# Patient Record
Sex: Female | Born: 1990 | Race: White | Hispanic: No | Marital: Married | Smoking: Never smoker
Health system: Southern US, Community
[De-identification: ages and names within clinical notes are randomized; demographics above are authoritative.]

## PROBLEM LIST (undated history)

## (undated) ENCOUNTER — Inpatient Hospital Stay (HOSPITAL_COMMUNITY): Payer: Self-pay

## (undated) DIAGNOSIS — D649 Anemia, unspecified: Secondary | ICD-10-CM

## (undated) DIAGNOSIS — H269 Unspecified cataract: Secondary | ICD-10-CM

## (undated) DIAGNOSIS — Z789 Other specified health status: Secondary | ICD-10-CM

## (undated) HISTORY — DX: Unspecified cataract: H26.9

## (undated) HISTORY — PX: NO PAST SURGERIES: SHX2092

## (undated) HISTORY — PX: IUD REMOVAL: SHX5392

## (undated) HISTORY — DX: Anemia, unspecified: D64.9

---
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
## 2010-09-20 ENCOUNTER — Inpatient Hospital Stay (HOSPITAL_COMMUNITY): Admission: RE | Admit: 2010-09-20 | Discharge: 2010-09-22 | Payer: Self-pay | Admitting: Obstetrics and Gynecology

## 2010-09-28 ENCOUNTER — Inpatient Hospital Stay (HOSPITAL_COMMUNITY): Admission: AD | Admit: 2010-09-28 | Discharge: 2010-09-28 | Payer: Self-pay | Admitting: Obstetrics and Gynecology

## 2010-09-28 ENCOUNTER — Ambulatory Visit: Payer: Self-pay | Admitting: Nurse Practitioner

## 2011-02-01 LAB — URINE CULTURE

## 2011-02-01 LAB — URINALYSIS, ROUTINE W REFLEX MICROSCOPIC
Bilirubin Urine: NEGATIVE
Specific Gravity, Urine: 1.025 (ref 1.005–1.030)
pH: 6 (ref 5.0–8.0)

## 2011-02-01 LAB — CBC
HCT: 36.7 % (ref 36.0–46.0)
Hemoglobin: 12.3 g/dL (ref 12.0–15.0)
MCV: 91.2 fL (ref 78.0–100.0)
Platelets: 138 10*3/uL — ABNORMAL LOW (ref 150–400)
RBC: 3.74 MIL/uL — ABNORMAL LOW (ref 3.87–5.11)
RBC: 3.99 MIL/uL (ref 3.87–5.11)
RDW: 13.7 % (ref 11.5–15.5)
WBC: 13.3 10*3/uL — ABNORMAL HIGH (ref 4.0–10.5)
WBC: 15.3 10*3/uL — ABNORMAL HIGH (ref 4.0–10.5)

## 2011-02-01 LAB — DIFFERENTIAL
Lymphocytes Relative: 7 % — ABNORMAL LOW (ref 12–46)
Lymphs Abs: 1 10*3/uL (ref 0.7–4.0)
Monocytes Absolute: 1.1 10*3/uL — ABNORMAL HIGH (ref 0.1–1.0)
Monocytes Relative: 8 % (ref 3–12)
Neutro Abs: 11.1 10*3/uL — ABNORMAL HIGH (ref 1.7–7.7)
Neutrophils Relative %: 84 % — ABNORMAL HIGH (ref 43–77)

## 2011-02-01 LAB — URINE MICROSCOPIC-ADD ON

## 2011-02-02 LAB — CBC
HCT: 36.6 % (ref 36.0–46.0)
Hemoglobin: 12.5 g/dL (ref 12.0–15.0)
MCH: 31 pg (ref 26.0–34.0)
MCHC: 34 g/dL (ref 30.0–36.0)
RBC: 4.01 MIL/uL (ref 3.87–5.11)

## 2011-02-02 LAB — ABO/RH: ABO/RH(D): O POS

## 2011-02-02 LAB — RPR: RPR Ser Ql: NONREACTIVE

## 2011-04-25 ENCOUNTER — Emergency Department (HOSPITAL_COMMUNITY)
Admission: EM | Admit: 2011-04-25 | Discharge: 2011-04-26 | Disposition: A | Discharge status: Home / Self Care | Payer: BC Managed Care – PPO | Attending: Emergency Medicine | Admitting: Emergency Medicine

## 2011-04-25 DIAGNOSIS — IMO0001 Reserved for inherently not codable concepts without codable children: Secondary | ICD-10-CM | POA: Insufficient documentation

## 2011-04-25 DIAGNOSIS — R509 Fever, unspecified: Secondary | ICD-10-CM | POA: Insufficient documentation

## 2011-04-25 DIAGNOSIS — B9789 Other viral agents as the cause of diseases classified elsewhere: Secondary | ICD-10-CM | POA: Insufficient documentation

## 2011-04-26 LAB — DIFFERENTIAL
Basophils Absolute: 0 10*3/uL (ref 0.0–0.1)
Eosinophils Relative: 0 % (ref 0–5)
Lymphocytes Relative: 3 % — ABNORMAL LOW (ref 12–46)
Lymphs Abs: 0.3 10*3/uL — ABNORMAL LOW (ref 0.7–4.0)
Neutro Abs: 10.4 10*3/uL — ABNORMAL HIGH (ref 1.7–7.7)
Neutrophils Relative %: 90 % — ABNORMAL HIGH (ref 43–77)

## 2011-04-26 LAB — URINALYSIS, ROUTINE W REFLEX MICROSCOPIC
Bilirubin Urine: NEGATIVE
Nitrite: NEGATIVE
Specific Gravity, Urine: 1.023 (ref 1.005–1.030)
Urobilinogen, UA: 1 mg/dL (ref 0.0–1.0)
pH: 6 (ref 5.0–8.0)

## 2011-04-26 LAB — CBC
HCT: 38.4 % (ref 36.0–46.0)
Hemoglobin: 12.9 g/dL (ref 12.0–15.0)
MCV: 84.2 fL (ref 78.0–100.0)
RBC: 4.56 MIL/uL (ref 3.87–5.11)
RDW: 12.7 % (ref 11.5–15.5)
WBC: 11.6 10*3/uL — ABNORMAL HIGH (ref 4.0–10.5)

## 2011-04-26 LAB — POCT I-STAT, CHEM 8
Chloride: 105 mEq/L (ref 96–112)
HCT: 39 % (ref 36.0–46.0)
Hemoglobin: 13.3 g/dL (ref 12.0–15.0)
Potassium: 3.4 mEq/L — ABNORMAL LOW (ref 3.5–5.1)
Sodium: 138 mEq/L (ref 135–145)

## 2012-10-14 ENCOUNTER — Emergency Department (HOSPITAL_COMMUNITY): Payer: BC Managed Care – PPO

## 2012-10-14 ENCOUNTER — Encounter (HOSPITAL_COMMUNITY): Payer: Self-pay | Admitting: *Deleted

## 2012-10-14 ENCOUNTER — Emergency Department (HOSPITAL_COMMUNITY)
Admission: EM | Admit: 2012-10-14 | Discharge: 2012-10-14 | Disposition: A | Discharge status: Home / Self Care | Payer: BC Managed Care – PPO | Attending: Emergency Medicine | Admitting: Emergency Medicine

## 2012-10-14 DIAGNOSIS — O4690 Antepartum hemorrhage, unspecified, unspecified trimester: Secondary | ICD-10-CM

## 2012-10-14 DIAGNOSIS — O209 Hemorrhage in early pregnancy, unspecified: Secondary | ICD-10-CM | POA: Insufficient documentation

## 2012-10-14 DIAGNOSIS — O200 Threatened abortion: Secondary | ICD-10-CM | POA: Insufficient documentation

## 2012-10-14 LAB — URINALYSIS, ROUTINE W REFLEX MICROSCOPIC
Bilirubin Urine: NEGATIVE
Ketones, ur: NEGATIVE mg/dL
Nitrite: NEGATIVE
Protein, ur: NEGATIVE mg/dL
pH: 7 (ref 5.0–8.0)

## 2012-10-14 LAB — WET PREP, GENITAL
Clue Cells Wet Prep HPF POC: NONE SEEN
Trich, Wet Prep: NONE SEEN
WBC, Wet Prep HPF POC: NONE SEEN

## 2012-10-14 LAB — URINE MICROSCOPIC-ADD ON: Urine-Other: NONE SEEN

## 2012-10-14 NOTE — ED Notes (Signed)
Patient states that she is 6-[redacted] weeks pregnant has not seen an OB GYN  Because she was waiting on her insurance to go through. Around 4:40 pm today while patient was at work she stood up and blood began "gushing" out of her. States she was spotting 3 days ago. Patient denies any pain at this time but states their is some mild cramping

## 2012-10-14 NOTE — ED Provider Notes (Signed)
Medical screening examination/treatment/procedure(s) were performed by non-physician practitioner and as supervising physician I was immediately available for consultation/collaboration. Devoria Albe, MD, Armando Gang   Ward Givens, MD 10/14/12 [phone]

## 2012-10-14 NOTE — ED Provider Notes (Signed)
History     CSN: [id number]  Arrival date & time 10/14/12  1723   First MD Initiated Contact with Patient 10/14/12 1728      No chief complaint on file.   (Consider location/radiation/quality/duration/timing/severity/associated sxs/prior treatment) HPI Comments: 21 year old female presents the emergency department with vaginal bleeding beginning about 45 minutes ago. Patient states she was sitting at work when she went to stand up and felt blood "gushing" from her vagina. She went to the bathroom and noticed a lot of blood coming out of her vagina. Currently she is without any abdominal pain or cramping, however states she has some cramping about 2 days ago. Admits to mild back pain. Patient is 6-[redacted] weeks pregnant, however she has not seen an OB/GYN yet due to insurance reasons. She does have an OB/GYN Dr. Hyacinth Meeker. This is not her first pregnancy. Last menstrual period at the end of September. Denies lightheadedness, weakness or shortness of breath.  The history is provided by the patient.    No past medical history on file.  No past surgical history on file.  No family history on file.  History  Substance Use Topics  . Smoking status: Not on file  . Smokeless tobacco: Not on file  . Alcohol Use: Not on file    OB History    No data available      Review of Systems  Constitutional: Negative for fever, chills and appetite change.  HENT: Negative.   Respiratory: Negative for shortness of breath.   Cardiovascular: Negative for chest pain.  Gastrointestinal: Negative for nausea, vomiting and abdominal pain.  Genitourinary: Positive for vaginal bleeding. Negative for dysuria, frequency, vaginal pain and pelvic pain.  Musculoskeletal: Positive for back pain.  Skin: Negative.   Neurological: Negative for light-headedness and headaches.  Psychiatric/Behavioral: The patient is not nervous/anxious.     Allergies  Review of patient's allergies indicates not on file.  Home  Medications  No current outpatient prescriptions on file.  There were no vitals taken for this visit.  Physical Exam  Nursing note and vitals reviewed. Constitutional: She is oriented to person, place, and time. She appears well-developed and well-nourished. No distress.  HENT:  Head: Normocephalic and atraumatic.  Mouth/Throat: Oropharynx is clear and moist.  Eyes: Conjunctivae normal are normal.  Neck: Normal range of motion. Neck supple.  Cardiovascular: Regular rhythm and normal heart sounds.  Tachycardia present.   Pulmonary/Chest: Effort normal and breath sounds normal.  Abdominal: Soft. Bowel sounds are normal. There is no tenderness.  Genitourinary: Uterus normal. Uterus is not tender. Cervix exhibits no motion tenderness and no discharge. Right adnexum displays tenderness. Right adnexum displays no mass. Left adnexum displays no mass and no tenderness. There is bleeding (large) around the vagina. No tenderness around the vagina. No vaginal discharge found.  Musculoskeletal: Normal range of motion. She exhibits no edema.  Neurological: She is alert and oriented to person, place, and time.  Skin: Skin is warm and dry.  Psychiatric: She has a normal mood and affect. Her behavior is normal.    ED Course  Procedures (including critical care time)  Labs Reviewed  URINALYSIS, ROUTINE W REFLEX MICROSCOPIC - Abnormal; Notable for the following:    Hgb urine dipstick MODERATE (*)     All other components within normal limits  POCT PREGNANCY, URINE - Abnormal; Notable for the following:    Preg Test, Ur POSITIVE (*)     All other components within normal limits  WET PREP, GENITAL  URINE  MICROSCOPIC-ADD ON  GC/CHLAMYDIA PROBE AMP   US Ob Comp Less 14 Wks  10/14/2012  *RADIOLOGY REPORT*  Clinical Data: Vaginal bleeding.  OBSTETRIC <14 WK ULTRASOUND  Technique:  Transabdominal ultrasound was performed for evaluation of the gestation as well as the maternal uterus and adnexal  regions.  Comparison:  None.  Intrauterine gestational sac: Single gestational sac is ovoid in shape. Yolk sac: Present. Embryo: Present. Cardiac Activity: Present. Heart Rate: 158 bpm  CRL:  15.1 mm  7 w  6 d       [place] EDC: 05/27/2013  Maternal uterus/Adnexae: Moderate volume of subchorionic hemorrhage.  Ovaries are normal in echotexture and appearance bilaterally. Trace volume of free fluid the cul-de-sac.  IMPRESSION: 1.  Single viable IUP with crown rump length of 15.1 mm corresponding to an estimated gestational age of [redacted] weeks and 6 days.  Fetal heart rate of 158 beats per minute. 2.  Moderate subchorionic hemorrhage.   Original Report Authenticated By: Trudie Reed, M.D.      1. Vaginal bleeding in pregnancy   2. Threatened abortion       MDM  21 y/o female with threatened abortion. US showing single viable IUP around 7 weeks and 6 days. Patient is in NAD and in no pain. I advised her to rest and avoid any hard physical activity until she sees ob/gyn. Explained importance of close follow up with Dr. Hyacinth Meeker. She is already on prenatal vitamins. Close return precautions discussed.        Trevor Mace, PA-C 10/14/12 1932

## 2012-10-14 NOTE — ED Notes (Signed)
Patient is alert and oriented x3.  She was given DC instructions and follow up visit instructions.  Patient gave verbal understanding. She was DC ambulatory under her own power to home.  V/S stable.  He was not showing any signs of distress on DC 

## 2012-10-15 LAB — GC/CHLAMYDIA PROBE AMP: GC Probe RNA: NEGATIVE

## 2012-11-01 ENCOUNTER — Emergency Department (HOSPITAL_COMMUNITY)
Admission: EM | Admit: 2012-11-01 | Discharge: 2012-11-02 | Disposition: A | Discharge status: Home / Self Care | Payer: BC Managed Care – PPO | Attending: Emergency Medicine | Admitting: Emergency Medicine

## 2012-11-01 DIAGNOSIS — O4690 Antepartum hemorrhage, unspecified, unspecified trimester: Secondary | ICD-10-CM

## 2012-11-01 DIAGNOSIS — R112 Nausea with vomiting, unspecified: Secondary | ICD-10-CM | POA: Insufficient documentation

## 2012-11-01 DIAGNOSIS — M549 Dorsalgia, unspecified: Secondary | ICD-10-CM | POA: Insufficient documentation

## 2012-11-01 DIAGNOSIS — O209 Hemorrhage in early pregnancy, unspecified: Secondary | ICD-10-CM | POA: Insufficient documentation

## 2012-11-01 DIAGNOSIS — O99891 Other specified diseases and conditions complicating pregnancy: Secondary | ICD-10-CM | POA: Insufficient documentation

## 2012-11-01 DIAGNOSIS — R109 Unspecified abdominal pain: Secondary | ICD-10-CM | POA: Insufficient documentation

## 2012-11-01 NOTE — ED Notes (Signed)
Pt states that she has had abdominal pain for about 2 weeks. Pt states she started having bleeding when standing up today. Pt describes the blood as thick, dark and mucus like. Pt states she only has bleeding when standing. Pt states she was diagnosed 2 weeks ago with hemorrhaging.

## 2012-11-02 LAB — URINALYSIS, ROUTINE W REFLEX MICROSCOPIC
Bilirubin Urine: NEGATIVE
Ketones, ur: 15 mg/dL — AB
Nitrite: NEGATIVE
Specific Gravity, Urine: 1.029 (ref 1.005–1.030)
Urobilinogen, UA: 1 mg/dL (ref 0.0–1.0)

## 2012-11-02 LAB — POCT I-STAT, CHEM 8
Calcium, Ion: 1.24 mmol/L — ABNORMAL HIGH (ref 1.12–1.23)
Glucose, Bld: 75 mg/dL (ref 70–99)
HCT: 35 % — ABNORMAL LOW (ref 36.0–46.0)
Hemoglobin: 11.9 g/dL — ABNORMAL LOW (ref 12.0–15.0)
Potassium: 3.7 mEq/L (ref 3.5–5.1)
TCO2: 22 mmol/L (ref 0–100)

## 2012-11-02 LAB — CBC WITH DIFFERENTIAL/PLATELET
Basophils Absolute: 0 10*3/uL (ref 0.0–0.1)
Basophils Relative: 0 % (ref 0–1)
HCT: 34.4 % — ABNORMAL LOW (ref 36.0–46.0)
MCHC: 35.5 g/dL (ref 30.0–36.0)
Monocytes Absolute: 0.7 10*3/uL (ref 0.1–1.0)
Neutro Abs: 6.5 10*3/uL (ref 1.7–7.7)
RDW: 12.3 % (ref 11.5–15.5)

## 2012-11-02 NOTE — ED Provider Notes (Signed)
History     CSN: [id number]  Arrival date & time 11/01/12  2249   First MD Initiated Contact with Patient 11/02/12 0010      Chief Complaint  Patient presents with  . Abdominal Pain  . Vaginal Bleeding    (Consider location/radiation/quality/duration/timing/severity/associated sxs/prior treatment) HPI Theresa Stewart is a 21 y.o. female who presents with complaint of vaginal bleeding. States she is about [redacted]wks pregnant, she is G2P1. States  Was seen 3 weeks ago for the same complaint here, at that time, she had blood work done, [place], which showed a live IUP at 7wk 6d,  and moderate subchorionic hemorrhage. Pt states after she was discharged, bleeding stopped utnil today. Pt denies having any abdominal pain. States hips hurting when she walks and having normal morning nausea and at times vomiting. Pt states she is passing clots when stands up. Unable to tell how heavy bleeding but changed pad 4 times today.     No past medical history on file.  No past surgical history on file.  Family History  Problem Relation Age of Onset  . Family history unknown: Yes    History  Substance Use Topics  . Smoking status: Never Smoker   . Smokeless tobacco: Not on file  . Alcohol Use: No    OB History    Grav Para Term Preterm Abortions TAB SAB Ect Mult Living   1               Review of Systems  Constitutional: Negative for fever and chills.  Respiratory: Negative.   Cardiovascular: Negative.   Gastrointestinal: Positive for nausea and vomiting. Negative for abdominal pain, diarrhea and constipation.  Genitourinary: Positive for vaginal bleeding. Negative for dysuria, flank pain, vaginal pain and pelvic pain.  Musculoskeletal: Positive for back pain.  Neurological: Negative for dizziness, weakness and light-headedness.    Allergies  Review of patient's allergies indicates no known allergies.  Home Medications   Current Outpatient Rx  Name  Route  Sig  Dispense  Refill  .  PRENATAL MULTIVITAMIN CH   Oral   Take 1 tablet by mouth daily.           BP 106/68  Pulse 79  Temp 99 F (37.2 C) (Oral)  Resp 18  SpO2 100%  LMP 08/20/2012  Physical Exam  Nursing note and vitals reviewed. Constitutional: She is oriented to person, place, and time. She appears well-developed and well-nourished. No distress.  HENT:  Head: Normocephalic.  Eyes: Conjunctivae normal are normal.  Neck: Neck supple.  Cardiovascular: Normal rate, regular rhythm and normal heart sounds.   Pulmonary/Chest: Effort normal and breath sounds normal. No respiratory distress. She has no wheezes. She has no rales.  Abdominal: Soft. Bowel sounds are normal. She exhibits no distension. There is no tenderness. There is no rebound and no guarding.  Genitourinary:       Normal external genitalia. Dark blood and small maroon and brown clots in the vaginal canal. Cervix is closed  Neurological: She is alert and oriented to person, place, and time.  Skin: Skin is warm and dry.  Psychiatric: She has a normal mood and affect. Her behavior is normal.    ED Course  Procedures (including critical care time)  Results for orders placed during the hospital encounter of 11/01/12  CBC WITH DIFFERENTIAL      Component Value Range   WBC 9.3  4.0 - 10.5 K/uL   RBC 4.12  3.87 - 5.11 MIL/uL  Hemoglobin 12.2  12.0 - 15.0 g/dL   HCT 16.1 (*) 09.6 - 04.5 %   MCV 83.5  78.0 - 100.0 fL   MCH 29.6  26.0 - 34.0 pg   MCHC 35.5  30.0 - 36.0 g/dL   RDW 40.9  81.1 - 91.4 %   Platelets 144 (*) 150 - 400 K/uL   Neutrophils Relative 70  43 - 77 %   Neutro Abs 6.5  1.7 - 7.7 K/uL   Lymphocytes Relative 22  12 - 46 %   Lymphs Abs 2.0  0.7 - 4.0 K/uL   Monocytes Relative 8  3 - 12 %   Monocytes Absolute 0.7  0.1 - 1.0 K/uL   Eosinophils Relative 1  0 - 5 %   Eosinophils Absolute 0.1  0.0 - 0.7 K/uL   Basophils Relative 0  0 - 1 %   Basophils Absolute 0.0  0.0 - 0.1 K/uL  URINALYSIS, ROUTINE W REFLEX MICROSCOPIC       Component Value Range   Color, Urine YELLOW  YELLOW   APPearance CLEAR  CLEAR   Specific Gravity, Urine 1.029  1.005 - 1.030   pH 5.5  5.0 - 8.0   Glucose, UA NEGATIVE  NEGATIVE mg/dL   Hgb urine dipstick LARGE (*) NEGATIVE   Bilirubin Urine NEGATIVE  NEGATIVE   Ketones, ur 15 (*) NEGATIVE mg/dL   Protein, ur NEGATIVE  NEGATIVE mg/dL   Urobilinogen, UA 1.0  0.0 - 1.0 mg/dL   Nitrite NEGATIVE  NEGATIVE   Leukocytes, UA NEGATIVE  NEGATIVE  POCT I-STAT, CHEM 8      Component Value Range   Sodium 138  135 - 145 mEq/L   Potassium 3.7  3.5 - 5.1 mEq/L   Chloride 104  96 - 112 mEq/L   BUN 7  6 - 23 mg/dL   Creatinine, Ser 7.82  0.50 - 1.10 mg/dL   Glucose, Bld 75  70 - 99 mg/dL   Calcium, Ion 9.56 (*) 1.12 - 1.23 mmol/L   TCO2 22  0 - 100 mmol/L   Hemoglobin 11.9 (*) 12.0 - 15.0 g/dL   HCT 21.3 (*) 08.6 - 57.8 %  URINE MICROSCOPIC-ADD ON      Component Value Range   Squamous Epithelial / LPF FEW (*) RARE   WBC, UA 0-2  <3 WBC/hpf   RBC / HPF 21-50  <3 RBC/hpf   Bacteria, UA FEW (*) RARE   Urine-Other MUCOUS PRESENT         1. Vaginal bleeding in pregnancy       MDM  Pt with vaginal bleeding during pregnancy, about 10wks. [place] 3 wks ago showed subchorionic hemorrhage. Pt is not having any abdominal pain, cramping, tenderness. Her lab work is unremarkable. Her exam showed closed cervix. I suspect pt is having either recurrent subchorionic hemorrhage or threatened abortion. I discussed pt with Dr. Hyacinth Meeker. Do not think any imaging today will change the treatment plan. Pt to be d/c home with pelvic rest, follow up with [facility] if bleeding worsens or develop abdominal pain. Pt voiced understanding.   Filed Vitals:   11/01/12 2315  BP: 106/68  Pulse: 79  Temp: 99 F (37.2 C)  TempSrc: Oral  Resp: 18  SpO2: 100%           Lottie Mussel, PA 11/02/12 [phone]

## 2012-11-02 NOTE — ED Provider Notes (Signed)
Medical screening examination/treatment/procedure(s) were performed by non-physician practitioner and as supervising physician I was immediately available for consultation/collaboration.    Vida Roller, MD 11/02/12 [phone]

## 2013-05-16 ENCOUNTER — Inpatient Hospital Stay (HOSPITAL_COMMUNITY)
Admission: AD | Admit: 2013-05-16 | Discharge: 2013-05-16 | Disposition: A | Discharge status: Home / Self Care | Payer: Medicaid Other | Source: Ambulatory Visit | Attending: Obstetrics and Gynecology | Admitting: Obstetrics and Gynecology

## 2013-05-16 ENCOUNTER — Encounter (HOSPITAL_COMMUNITY): Payer: Self-pay | Admitting: *Deleted

## 2013-05-16 DIAGNOSIS — O368190 Decreased fetal movements, unspecified trimester, not applicable or unspecified: Secondary | ICD-10-CM | POA: Insufficient documentation

## 2013-05-16 DIAGNOSIS — O368130 Decreased fetal movements, third trimester, not applicable or unspecified: Secondary | ICD-10-CM

## 2013-05-16 HISTORY — DX: Other specified health status: Z78.9

## 2013-05-16 NOTE — MAU Note (Signed)
Pt reports has not felt baby movement  much since yesterday.

## 2013-05-16 NOTE — MAU Provider Note (Signed)
  History     CSN: [id number]  Arrival date and time: 05/16/13 2036   None     Chief Complaint  Patient presents with  . Decreased Fetal Movement   HPI  Theresa Stewart is a 22 y.o. G2P1001 at [redacted]w[redacted]d who presents today with decreased fetl movement. She had not felt the baby moved since 1130 today, until she got in the care to come here. At that time she began to feel the baby move some.   Past Medical History  Diagnosis Date  . Medical history non-contributory     Past Surgical History  Procedure Laterality Date  . No past surgeries      History reviewed. No pertinent family history.  History  Substance Use Topics  . Smoking status: Never Smoker   . Smokeless tobacco: Not on file  . Alcohol Use: No    Allergies: No Known Allergies  Prescriptions prior to admission  Medication Sig Dispense Refill  . calcium carbonate (TUMS EX) 750 MG chewable tablet Chew 2 tablets by mouth at bedtime as needed for heartburn.      . Prenatal Vit-Fe Fumarate-FA (PRENATAL MULTIVITAMIN) TABS Take 1 tablet by mouth at bedtime.         ROS Physical Exam   Blood pressure 129/84, pulse 100, temperature 98.6 F (37 C), temperature source Oral, resp. rate 20, height 5\' 9"  (1.753 m), weight 82.827 kg (182 lb 9.6 oz), last menstrual period 08/20/2012.  Physical Exam  NST: 125, moderate variability with 15x15 accels, no decels Toco: no UCs  MAU Course  Procedures  2108: Spoke with Dr. Ambrose Mantle, ok for dc home.   Assessment and Plan   1. Decreased fetal movement in pregnancy in third trimester, antepartum    Reactive NST Fetal kick counts Labor precautions FU with the office as scheduled.    Tawnya Crook 05/16/2013, 9:08 PM

## 2013-05-21 ENCOUNTER — Observation Stay (HOSPITAL_COMMUNITY)
Admission: EM | Admit: 2013-05-21 | Discharge: 2013-05-22 | Disposition: A | Discharge status: Home / Self Care | Payer: No Typology Code available for payment source | Attending: Obstetrics and Gynecology | Admitting: Obstetrics and Gynecology

## 2013-05-21 ENCOUNTER — Encounter (HOSPITAL_COMMUNITY): Payer: Self-pay | Admitting: *Deleted

## 2013-05-21 DIAGNOSIS — R109 Unspecified abdominal pain: Secondary | ICD-10-CM | POA: Insufficient documentation

## 2013-05-21 DIAGNOSIS — Z3490 Encounter for supervision of normal pregnancy, unspecified, unspecified trimester: Secondary | ICD-10-CM

## 2013-05-21 DIAGNOSIS — R51 Headache: Secondary | ICD-10-CM | POA: Insufficient documentation

## 2013-05-21 DIAGNOSIS — Y92410 Unspecified street and highway as the place of occurrence of the external cause: Secondary | ICD-10-CM | POA: Insufficient documentation

## 2013-05-21 DIAGNOSIS — O479 False labor, unspecified: Principal | ICD-10-CM | POA: Insufficient documentation

## 2013-05-21 LAB — CBC
Platelets: 163 10*3/uL (ref 150–400)
RBC: 3.96 MIL/uL (ref 3.87–5.11)
RDW: 14.5 % (ref 11.5–15.5)
WBC: 11.7 10*3/uL — ABNORMAL HIGH (ref 4.0–10.5)

## 2013-05-21 LAB — OB RESULTS CONSOLE ANTIBODY SCREEN: Antibody Screen: NEGATIVE

## 2013-05-21 LAB — OB RESULTS CONSOLE RUBELLA ANTIBODY, IGM: Rubella: IMMUNE

## 2013-05-21 LAB — OB RESULTS CONSOLE HIV ANTIBODY (ROUTINE TESTING): HIV: NONREACTIVE

## 2013-05-21 LAB — TYPE AND SCREEN
ABO/RH(D): O POS
Antibody Screen: NEGATIVE

## 2013-05-21 LAB — RPR: RPR Ser Ql: NONREACTIVE

## 2013-05-21 LAB — OB RESULTS CONSOLE HEPATITIS B SURFACE ANTIGEN: Hepatitis B Surface Ag: NEGATIVE

## 2013-05-21 MED ORDER — CALCIUM CARBONATE ANTACID 500 MG PO CHEW
2.0000 | CHEWABLE_TABLET | ORAL | Status: DC | PRN
  Administered 2013-05-21: 400 mg via ORAL
  Filled 2013-05-21: qty 2

## 2013-05-21 MED ORDER — ACETAMINOPHEN 325 MG PO TABS
650.0000 mg | ORAL_TABLET | ORAL | Status: DC | PRN
  Administered 2013-05-21 – 2013-05-22 (×4): 650 mg via ORAL
  Filled 2013-05-21 (×4): qty 2

## 2013-05-21 MED ORDER — ZOLPIDEM TARTRATE 5 MG PO TABS
5.0000 mg | ORAL_TABLET | Freq: Every evening | ORAL | Status: DC | PRN
  Administered 2013-05-21: 5 mg via ORAL
  Filled 2013-05-21: qty 1

## 2013-05-21 MED ORDER — SODIUM CHLORIDE 0.9 % IJ SOLN: 3.0000 mL | INTRAMUSCULAR | Status: DC | PRN

## 2013-05-21 MED ORDER — DOCUSATE SODIUM 100 MG PO CAPS
100.0000 mg | ORAL_CAPSULE | Freq: Every day | ORAL | Status: DC
  Administered 2013-05-21: 100 mg via ORAL
  Filled 2013-05-21: qty 1

## 2013-05-21 MED ORDER — PRENATAL MULTIVITAMIN CH
1.0000 | ORAL_TABLET | Freq: Every day | ORAL | Status: DC
  Filled 2013-05-21: qty 1

## 2013-05-21 NOTE — ED Notes (Signed)
Report given to [facility]. Pt being prepared for transport.
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
## 2013-05-21 NOTE — ED Notes (Signed)
OB RN at the bedside

## 2013-05-21 NOTE — ED Notes (Signed)
[facility] contacted for transport 

## 2013-05-21 NOTE — ED Notes (Signed)
[facility] called to [facility]. [facility] called for transport.
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
## 2013-05-22 DIAGNOSIS — Z3490 Encounter for supervision of normal pregnancy, unspecified, unspecified trimester: Secondary | ICD-10-CM

## 2013-05-22 MED ORDER — CYCLOBENZAPRINE HCL 10 MG PO TABS
10.0000 mg | ORAL_TABLET | Freq: Once | ORAL | Status: AC
  Administered 2013-05-22: 10 mg via ORAL
  Filled 2013-05-22: qty 1

## 2013-05-22 MED ORDER — ACETAMINOPHEN 325 MG PO TABS: 650.0000 mg | ORAL_TABLET | ORAL | Status: DC | PRN
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
## 2013-05-22 NOTE — Progress Notes (Signed)
Patient ID: Theresa Stewart, female   DOB: [birth year], 22 y.o.   MRN: [id number] Pt very sore this AM but contractions have definitely improved Baby has been reactive with a baseline of 120.  Very active! Will d/c home this PM after 24 hours of monitoring

## 2013-05-22 NOTE — Progress Notes (Signed)
Ur chart review completed.  

## 2013-05-24 ENCOUNTER — Inpatient Hospital Stay (HOSPITAL_COMMUNITY)
Admission: AD | Admit: 2013-05-24 | Discharge: 2013-05-27 | DRG: 372 | Disposition: A | Discharge status: Home / Self Care | Payer: BC Managed Care – PPO | Source: Ambulatory Visit | Attending: Obstetrics and Gynecology | Admitting: Obstetrics and Gynecology

## 2013-05-24 ENCOUNTER — Other Ambulatory Visit: Payer: Self-pay | Admitting: Obstetrics and Gynecology

## 2013-05-24 DIAGNOSIS — Z3490 Encounter for supervision of normal pregnancy, unspecified, unspecified trimester: Secondary | ICD-10-CM

## 2013-05-24 LAB — CBC
Platelets: 149 10*3/uL — ABNORMAL LOW (ref 150–400)
RDW: 14.6 % (ref 11.5–15.5)
WBC: 9.2 10*3/uL (ref 4.0–10.5)

## 2013-05-24 MED ORDER — OXYTOCIN 40 UNITS IN LACTATED RINGERS INFUSION - SIMPLE MED
1.0000 m[IU]/min | INTRAVENOUS | Status: DC
  Administered 2013-05-24: 1 m[IU]/min via INTRAVENOUS
  Filled 2013-05-24: qty 1000

## 2013-05-24 MED ORDER — OXYTOCIN 40 UNITS IN LACTATED RINGERS INFUSION - SIMPLE MED: 62.5000 mL/h | INTRAVENOUS | Status: DC

## 2013-05-24 MED ORDER — CLINDAMYCIN PHOSPHATE 900 MG/50ML IV SOLN
900.0000 mg | Freq: Three times a day (TID) | INTRAVENOUS | Status: DC
  Filled 2013-05-24: qty 50

## 2013-05-24 MED ORDER — OXYCODONE-ACETAMINOPHEN 5-325 MG PO TABS
1.0000 | ORAL_TABLET | ORAL | Status: DC | PRN
  Administered 2013-05-25: 1 via ORAL
  Filled 2013-05-24: qty 1

## 2013-05-24 MED ORDER — OXYTOCIN BOLUS FROM INFUSION
500.0000 mL | INTRAVENOUS | Status: DC
  Administered 2013-05-25: 500 mL via INTRAVENOUS

## 2013-05-24 MED ORDER — CITRIC ACID-SODIUM CITRATE 334-500 MG/5ML PO SOLN
30.0000 mL | Freq: Once | ORAL | Status: DC
  Filled 2013-05-24: qty 15

## 2013-05-24 MED ORDER — LACTATED RINGERS IV SOLN: INTRAVENOUS | Status: DC

## 2013-05-24 MED ORDER — IBUPROFEN 600 MG PO TABS
600.0000 mg | ORAL_TABLET | Freq: Four times a day (QID) | ORAL | Status: DC | PRN
  Administered 2013-05-25: 600 mg via ORAL
  Filled 2013-05-24: qty 1

## 2013-05-24 MED ORDER — LIDOCAINE HCL (PF) 1 % IJ SOLN
30.0000 mL | INTRAMUSCULAR | Status: DC | PRN
  Filled 2013-05-24 (×2): qty 30

## 2013-05-24 MED ORDER — LACTATED RINGERS IV SOLN: 500.0000 mL | INTRAVENOUS | Status: DC | PRN
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
## 2013-05-25 ENCOUNTER — Encounter (HOSPITAL_COMMUNITY): Payer: Self-pay | Admitting: Family Medicine

## 2013-05-25 ENCOUNTER — Encounter (HOSPITAL_COMMUNITY): Payer: Self-pay | Admitting: Anesthesiology

## 2013-05-25 ENCOUNTER — Inpatient Hospital Stay (HOSPITAL_COMMUNITY): Payer: BC Managed Care – PPO | Admitting: Anesthesiology

## 2013-05-25 LAB — POSTPARTUM HEMORRHAGE PROTOCOL (BB NOTIFICATION)

## 2013-05-25 LAB — CBC
HCT: 30.2 % — ABNORMAL LOW (ref 36.0–46.0)
Hemoglobin: 9.6 g/dL — ABNORMAL LOW (ref 12.0–15.0)
Hemoglobin: 8.9 g/dL — ABNORMAL LOW (ref 12.0–15.0)
MCH: 25.1 pg — ABNORMAL LOW (ref 26.0–34.0)
MCH: 25.1 pg — ABNORMAL LOW (ref 26.0–34.0)
MCHC: 31.8 g/dL (ref 30.0–36.0)
MCHC: 31.9 g/dL (ref 30.0–36.0)
MCV: 78.9 fL (ref 78.0–100.0)
Platelets: 149 10*3/uL — ABNORMAL LOW (ref 150–400)
RDW: 14.5 % (ref 11.5–15.5)

## 2013-05-25 LAB — DIC (DISSEMINATED INTRAVASCULAR COAGULATION)PANEL
D-Dimer, Quant: 1.58 ug/mL-FEU — ABNORMAL HIGH (ref 0.00–0.48)
Fibrinogen: 432 mg/dL (ref 204–475)
Platelets: 151 10*3/uL (ref 150–400)
Smear Review: NONE SEEN

## 2013-05-25 LAB — PREPARE RBC (CROSSMATCH)

## 2013-05-25 MED ORDER — DIPHENHYDRAMINE HCL 50 MG/ML IJ SOLN: 12.5000 mg | INTRAMUSCULAR | Status: DC | PRN

## 2013-05-25 MED ORDER — ONDANSETRON HCL 4 MG PO TABS: 4.0000 mg | ORAL_TABLET | ORAL | Status: DC | PRN

## 2013-05-25 MED ORDER — IBUPROFEN 600 MG PO TABS
600.0000 mg | ORAL_TABLET | Freq: Four times a day (QID) | ORAL | Status: DC
  Administered 2013-05-25 – 2013-05-27 (×8): 600 mg via ORAL
  Filled 2013-05-25 (×8): qty 1

## 2013-05-25 MED ORDER — BENZOCAINE-MENTHOL 20-0.5 % EX AERO
1.0000 "application " | INHALATION_SPRAY | CUTANEOUS | Status: DC | PRN
  Filled 2013-05-25: qty 56

## 2013-05-25 MED ORDER — CARBOPROST TROMETHAMINE 250 MCG/ML IM SOLN
INTRAMUSCULAR | Status: AC
  Administered 2013-05-25: 250 ug
  Filled 2013-05-25: qty 1

## 2013-05-25 MED ORDER — TETANUS-DIPHTH-ACELL PERTUSSIS 5-2.5-18.5 LF-MCG/0.5 IM SUSP: 0.5000 mL | Freq: Once | INTRAMUSCULAR | Status: DC

## 2013-05-25 MED ORDER — DIBUCAINE 1 % RE OINT: 1.0000 "application " | TOPICAL_OINTMENT | RECTAL | Status: DC | PRN

## 2013-05-25 MED ORDER — OXYTOCIN 40 UNITS IN LACTATED RINGERS INFUSION - SIMPLE MED: 62.5000 mL/h | INTRAVENOUS | Status: AC | PRN

## 2013-05-25 MED ORDER — LACTATED RINGERS IV SOLN: 500.0000 mL | Freq: Once | INTRAVENOUS | Status: DC

## 2013-05-25 MED ORDER — METHYLERGONOVINE MALEATE 0.2 MG/ML IJ SOLN: 0.2000 mg | Freq: Once | INTRAMUSCULAR | Status: DC

## 2013-05-25 MED ORDER — OXYCODONE-ACETAMINOPHEN 5-325 MG PO TABS
1.0000 | ORAL_TABLET | ORAL | Status: DC | PRN
  Administered 2013-05-25 – 2013-05-27 (×6): 1 via ORAL
  Filled 2013-05-25 (×6): qty 1

## 2013-05-25 MED ORDER — LIDOCAINE HCL (PF) 1 % IJ SOLN
INTRAMUSCULAR | Status: DC | PRN
  Administered 2013-05-25: 6 mL
  Administered 2013-05-25: 4 mL

## 2013-05-25 MED ORDER — EPHEDRINE 5 MG/ML INJ
10.0000 mg | INTRAVENOUS | Status: DC | PRN
  Filled 2013-05-25: qty 4
  Filled 2013-05-25: qty 2

## 2013-05-25 MED ORDER — LANOLIN HYDROUS EX OINT: TOPICAL_OINTMENT | CUTANEOUS | Status: DC | PRN

## 2013-05-25 MED ORDER — FENTANYL 2.5 MCG/ML BUPIVACAINE 1/10 % EPIDURAL INFUSION (WH - ANES)
14.0000 mL/h | INTRAMUSCULAR | Status: DC | PRN
  Filled 2013-05-25: qty 125

## 2013-05-25 MED ORDER — FENTANYL 2.5 MCG/ML BUPIVACAINE 1/10 % EPIDURAL INFUSION (WH - ANES)
INTRAMUSCULAR | Status: DC | PRN
  Administered 2013-05-25: 14 mL/h via EPIDURAL

## 2013-05-25 MED ORDER — OXYTOCIN 40 UNITS IN LACTATED RINGERS INFUSION - SIMPLE MED
INTRAVENOUS | Status: AC
  Administered 2013-05-25: 40 [IU]
  Filled 2013-05-25: qty 1000

## 2013-05-25 MED ORDER — PRENATAL MULTIVITAMIN CH
1.0000 | ORAL_TABLET | Freq: Every day | ORAL | Status: DC
  Filled 2013-05-25: qty 1

## 2013-05-25 MED ORDER — ZOLPIDEM TARTRATE 5 MG PO TABS: 5.0000 mg | ORAL_TABLET | Freq: Every evening | ORAL | Status: DC | PRN

## 2013-05-25 MED ORDER — DIPHENOXYLATE-ATROPINE 2.5-0.025 MG PO TABS
1.0000 | ORAL_TABLET | Freq: Once | ORAL | Status: AC
  Administered 2013-05-25: 1 via ORAL
  Filled 2013-05-25: qty 1

## 2013-05-25 MED ORDER — CALCIUM CARBONATE ANTACID 500 MG PO CHEW
750.0000 mg | CHEWABLE_TABLET | Freq: Every day | ORAL | Status: DC | PRN
Start: 2013-05-25 — End: 2013-05-27

## 2013-05-25 MED ORDER — SENNOSIDES-DOCUSATE SODIUM 8.6-50 MG PO TABS
2.0000 | ORAL_TABLET | Freq: Every day | ORAL | Status: DC
  Administered 2013-05-25 – 2013-05-26 (×2): 2 via ORAL

## 2013-05-25 MED ORDER — EPHEDRINE 5 MG/ML INJ
10.0000 mg | INTRAVENOUS | Status: DC | PRN
  Filled 2013-05-25: qty 2

## 2013-05-25 MED ORDER — CARBOPROST TROMETHAMINE 250 MCG/ML IM SOLN
250.0000 ug | Freq: Once | INTRAMUSCULAR | Status: AC
  Administered 2013-05-25: 250 ug via INTRAMUSCULAR

## 2013-05-25 MED ORDER — DIPHENHYDRAMINE HCL 25 MG PO CAPS: 25.0000 mg | ORAL_CAPSULE | Freq: Four times a day (QID) | ORAL | Status: DC | PRN

## 2013-05-25 MED ORDER — PHENYLEPHRINE 40 MCG/ML (10ML) SYRINGE FOR IV PUSH (FOR BLOOD PRESSURE SUPPORT)
80.0000 ug | PREFILLED_SYRINGE | INTRAVENOUS | Status: DC | PRN
  Filled 2013-05-25: qty 5
  Filled 2013-05-25: qty 2

## 2013-05-25 MED ORDER — LACTATED RINGERS IV SOLN
INTRAVENOUS | Status: AC
  Administered 2013-05-25: 09:00:00 via INTRAVENOUS

## 2013-05-25 MED ORDER — WITCH HAZEL-GLYCERIN EX PADS: 1.0000 "application " | MEDICATED_PAD | CUTANEOUS | Status: DC | PRN

## 2013-05-25 MED ORDER — ONDANSETRON HCL 4 MG/2ML IJ SOLN
4.0000 mg | INTRAMUSCULAR | Status: DC | PRN
  Administered 2013-05-25: 4 mg via INTRAVENOUS
  Filled 2013-05-25: qty 2

## 2013-05-25 MED ORDER — PRENATAL MULTIVITAMIN CH
1.0000 | ORAL_TABLET | Freq: Every day | ORAL | Status: DC
  Administered 2013-05-25 – 2013-05-26 (×3): 1 via ORAL
  Filled 2013-05-25 (×2): qty 1

## 2013-05-25 MED ORDER — PHENYLEPHRINE 40 MCG/ML (10ML) SYRINGE FOR IV PUSH (FOR BLOOD PRESSURE SUPPORT)
80.0000 ug | PREFILLED_SYRINGE | INTRAVENOUS | Status: DC | PRN
  Filled 2013-05-25: qty 2

## 2013-05-25 MED ORDER — METHYLERGONOVINE MALEATE 0.2 MG/ML IJ SOLN
INTRAMUSCULAR | Status: AC
  Administered 2013-05-25: 0.2 mg
  Filled 2013-05-25: qty 1

## 2013-05-25 MED ORDER — MEASLES, MUMPS & RUBELLA VAC ~~LOC~~ INJ
0.5000 mL | INJECTION | Freq: Once | SUBCUTANEOUS | Status: DC
  Filled 2013-05-25: qty 0.5

## 2013-05-25 MED ORDER — CARBOPROST TROMETHAMINE 250 MCG/ML IM SOLN
INTRAMUSCULAR | Status: AC
  Filled 2013-05-25: qty 1

## 2013-05-25 MED ORDER — SIMETHICONE 80 MG PO CHEW: 80.0000 mg | CHEWABLE_TABLET | ORAL | Status: DC | PRN

## 2013-05-25 NOTE — Progress Notes (Signed)
Patient ID: Theresa Stewart, female   DOB: 08-18-1991, 22 y.o.   MRN: [id number] Exam reveals no clots in the uterus and the uterus is well contracted.

## 2013-05-25 NOTE — Anesthesia Preprocedure Evaluation (Signed)
Anesthesia Evaluation  Patient identified by MRN, date of birth, ID band Patient awake    Reviewed: Allergy & Precautions, H&P , NPO status , Patient's Chart, lab work & pertinent test results  Airway Mallampati: II TM Distance: >3 FB Neck ROM: Full    Dental  (+) Dental Advisory Given   Pulmonary neg pulmonary ROS,  breath sounds clear to auscultation        Cardiovascular negative cardio ROS  Rhythm:Regular Rate:Normal     Neuro/Psych negative neurological ROS  negative psych ROS   GI/Hepatic negative GI ROS, Neg liver ROS,   Endo/Other  negative endocrine ROS  Renal/GU negative Renal ROS     Musculoskeletal negative musculoskeletal ROS (+)   Abdominal   Peds  Hematology negative hematology ROS (+)   Anesthesia Other Findings   Reproductive/Obstetrics (+) Pregnancy                           Anesthesia Physical Anesthesia Plan  ASA: II  Anesthesia Plan: Epidural   Post-op Pain Management:    Induction:   Airway Management Planned:   Additional Equipment:   Intra-op Plan:   Post-operative Plan:   Informed Consent: I have reviewed the patients History and Physical, chart, labs and discussed the procedure including the risks, benefits and alternatives for the proposed anesthesia with the patient or authorized representative who has indicated his/her understanding and acceptance.     Plan Discussed with:   Anesthesia Plan Comments:         Anesthesia Quick Evaluation  

## 2013-05-25 NOTE — Progress Notes (Signed)
Patient ID: Theresa Stewart, female   DOB: 28-Mar-1991, 22 y.o.   MRN: [id number] i EXAMINED HER AT 4:35 aqm AND FOUND NO CLOTS IN THE UTERUS. i WILL RECHECK HER 30 MINUTES LATER

## 2013-05-25 NOTE — Progress Notes (Signed)
Patient ID: Theresa Stewart, female   DOB: October 31, 1991, 22 y.o.   MRN: [id number] I reexamined this lady just before 4:00AM and found more blood clots. She was given a second dose of hemabate and was given 0.2 mg methergine. I evacuated more clots at around 4:15 and the total since delivery has been 350 cc's measured. I will reevaluate her in 10 minutes and if she continues to fill the uterus with clots we will go to the OR to see if a D&C will help and maybe insert a balloon prior to hysterectomy.

## 2013-05-25 NOTE — Progress Notes (Signed)
Patient ID: Theresa Stewart, female   DOB: 1991/06/01, 22 y.o.   MRN: [id number] Delivery note:  The pt progressed rapidly to full dilatation and pushed well. She delivered spontaneously ROA over an intact perineum a living female infant with Apgars 9 and 10 at 1 and 5 minutes. Before the placenta delivered there was brisk bleeding. The placenta was removed seemingly intact but bleeding continued so at 2:12 AM an ampoule of hemabate was given With continued pitocin and massage and emptying of the bladder, the uterus seemed to firm and the last exam found no new clots in the uterus. There were no lacerations. I will continue to watch her bleeding closely and will be prepared to insert a balloon, do a D&C or possibly a hysterectomy. Blood loss was measured at 250cc's but I estimated 750 cc's.

## 2013-05-25 NOTE — Progress Notes (Signed)
Patient ID: Theresa Stewart, female   DOB: [birth year], 22 y.o.   MRN: [id number] Pt received the epidural and the pitocin was discontinues. The cervix is 6 cm 100% effaced and the vertex is at -1 station.

## 2013-05-25 NOTE — Progress Notes (Signed)
Patient ID: Theresa Stewart, female   DOB: 03/22/1991, 22 y.o.   MRN: [id number] DOD No complaints Has been able to ambulate without dizziness and void. hgb 9.6 TO 8.9

## 2013-05-25 NOTE — Anesthesia Procedure Notes (Signed)
Epidural Patient location during procedure: OB Start time: 05/25/2013 1:55 AM End time: 05/25/2013 2:10 AM  Staffing Anesthesiologist: Lewie Loron R Performed by: anesthesiologist   Preanesthetic Checklist Completed: patient identified, pre-op evaluation, timeout performed, IV checked, risks and benefits discussed and monitors and equipment checked  Epidural Patient position: sitting Prep: site prepped and draped and DuraPrep Patient monitoring: heart rate, continuous pulse ox and blood pressure Approach: midline Injection technique: LOR saline and LOR air  Needle:  Needle type: Tuohy  Needle gauge: 17 G Needle length: 9 cm Needle insertion depth: 6 cm Catheter type: closed end flexible Catheter size: 19 Gauge Catheter at skin depth: 12 cm Test dose: negative  Assessment Sensory level: T8 Events: blood not aspirated, injection not painful, no injection resistance, negative IV test and no paresthesia  Additional Notes Reason for block:procedure for pain

## 2013-05-25 NOTE — Progress Notes (Signed)
Patient ID: Theresa Stewart, female   DOB: [birth year], 22 y.o.   MRN: [id number] At 1:30 AM the pitocin was at 11 mu/ minute and the cervix was 3 cm 90% effaced and the vertex was at -2/-3 station. AROM produced clear fluid. Her contractions became more intense and the pitocin was decreased gradually to 7 mu/ minute. She asked for and will receive an epidural

## 2013-05-26 LAB — CBC
Platelets: 121 10*3/uL — ABNORMAL LOW (ref 150–400)
RBC: 2.59 MIL/uL — ABNORMAL LOW (ref 3.87–5.11)
RDW: 14.8 % (ref 11.5–15.5)
WBC: 8.3 10*3/uL (ref 4.0–10.5)

## 2013-05-26 NOTE — Lactation Note (Signed)
This note was copied from the chart of Theresa Wendi Snipes. Lactation Consultation Note: Follow up visit with mom. Baby had fed about 45 minutes ago and was not real hungry. Mom reports that nipples are sore but a little better than yesterday. Slightly raw on tips of both nipples. Using comfort gels and reports that they are helping. Assisted mom with getting deeper latch and untucked bottom lip. Mom reports pain of 2-3 on scale of 10. Mom reports that she had mastitis with 1st baby. Discussed ways to prevent it- frequent emptying of the breast. Big sister in to see baby- will follow up this afternoon.   Patient Name: Theresa Stewart HQION'G Date: 05/26/2013 Reason for consult: Follow-up assessment   Maternal Data    Feeding Feeding Type: Breast Milk Feeding method: Breast Length of feed: 5 min  LATCH Score/Interventions Latch: Grasps breast easily, tongue down, lips flanged, rhythmical sucking.  Audible Swallowing: A few with stimulation Intervention(s): Skin to skin;Hand expression  Type of Nipple: Everted at rest and after stimulation  Comfort (Breast/Nipple): Filling, red/small blisters or bruises, mild/mod discomfort  Problem noted: Mild/Moderate discomfort Interventions (Mild/moderate discomfort): Comfort gels  Hold (Positioning): Assistance needed to correctly position infant at breast and maintain latch. Intervention(s): Breastfeeding basics reviewed;Support Pillows;Position options;Skin to skin  LATCH Score: 7  Lactation Tools Discussed/Used     Consult Status Consult Status: Follow-up Date: 05/26/13 Follow-up type: In-patient    Pamelia Hoit 05/26/2013, 11:28 AM

## 2013-05-26 NOTE — Progress Notes (Signed)
Patient ID: Theresa Stewart, female   DOB: [birth year], 22 y.o.   MRN: [id number] #1 AFEBRILE BP  Normal HGB 6.6 Pt is asymptomatic. There is no bleeding. The HGB has settled where I predicted. She is ambulating without problems

## 2013-05-27 MED ORDER — OXYCODONE-ACETAMINOPHEN 5-325 MG PO TABS: 1.0000 | ORAL_TABLET | Freq: Four times a day (QID) | ORAL | Status: DC | PRN

## 2013-05-27 MED ORDER — IBUPROFEN 600 MG PO TABS: 600.0000 mg | ORAL_TABLET | Freq: Four times a day (QID) | ORAL | Status: DC | PRN
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
## 2013-05-27 NOTE — Progress Notes (Signed)
Patient ID: Theresa Stewart, female   DOB: 1991-09-09, 22 y.o.   MRN: [id number] #2 afebrile BP normal For d/c

## 2013-05-28 LAB — TYPE AND SCREEN: Unit division: 0

## 2013-05-30 ENCOUNTER — Telehealth: Payer: Self-pay

## 2013-05-30 NOTE — Telephone Encounter (Signed)
Opened in error

## 2013-06-06 ENCOUNTER — Ambulatory Visit: Payer: Self-pay | Admitting: Obstetrics and Gynecology

## 2013-06-06 ENCOUNTER — Ambulatory Visit: Payer: Self-pay | Admitting: Obstetrics & Gynecology

## 2014-02-13 IMAGING — US US OB COMP LESS 14 WK
1 series · 14 of 28 positions shown · non-contrast
Comparison: None.

CLINICAL DATA: Vaginal bleeding.

OBSTETRIC <14 WK ULTRASOUND
TECHNIQUE: Transabdominal ultrasound was performed for evaluation
of the gestation as well as the maternal uterus and adnexal
regions.

[Series 1: us ob comp less 14 wk · 0.28mm/px · 14 of 52 slices shown]
[im 2/52]
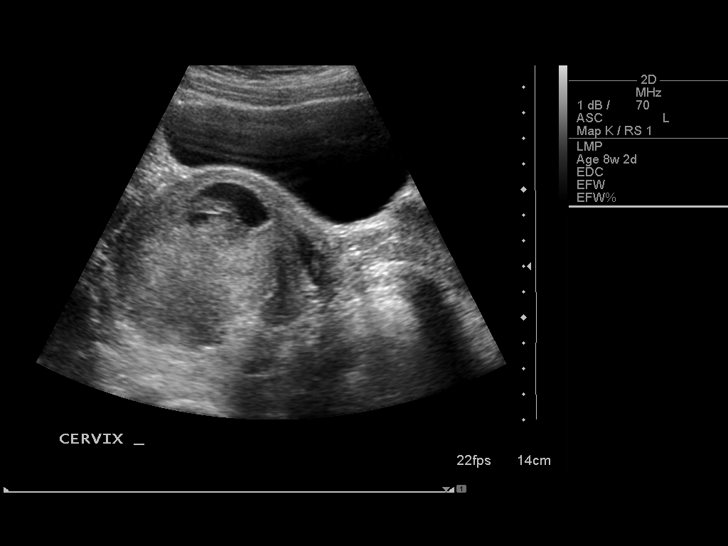
[im 6/52]
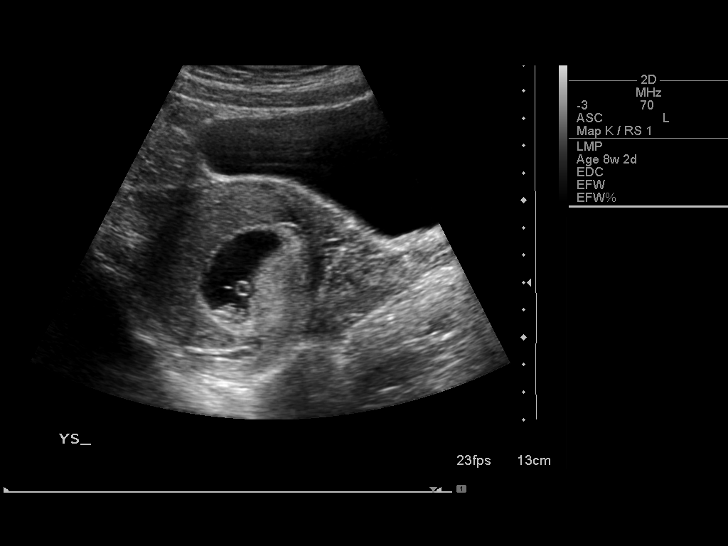
[im 10/52]
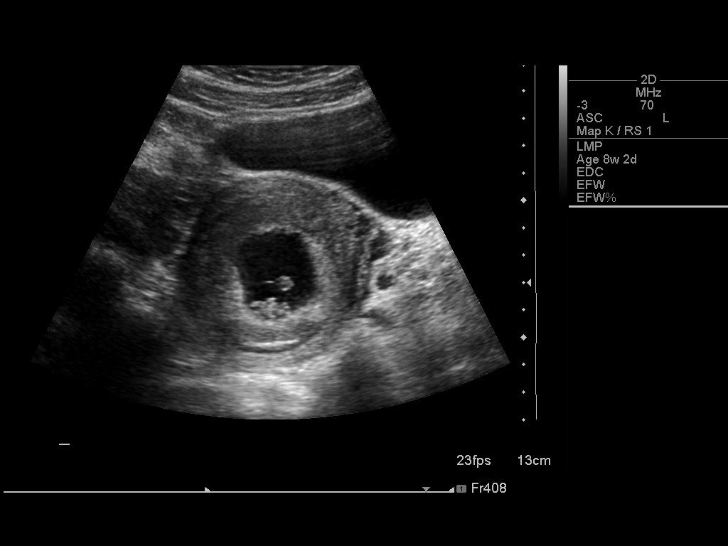
[im 14/52]
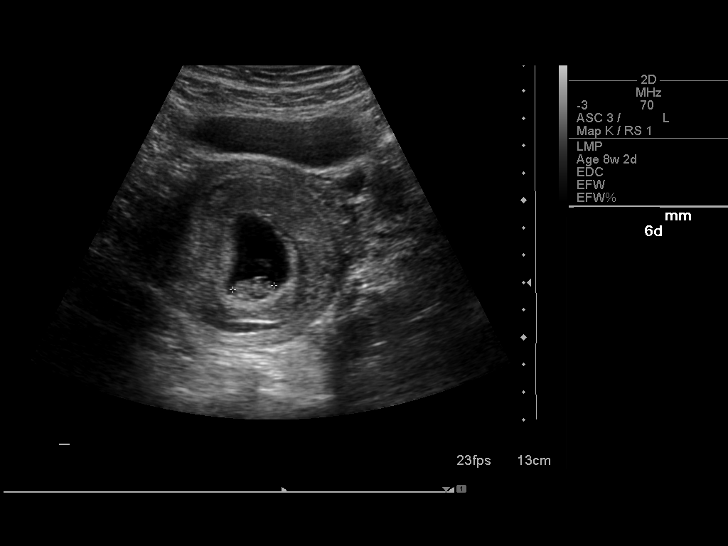
[im 18/52]
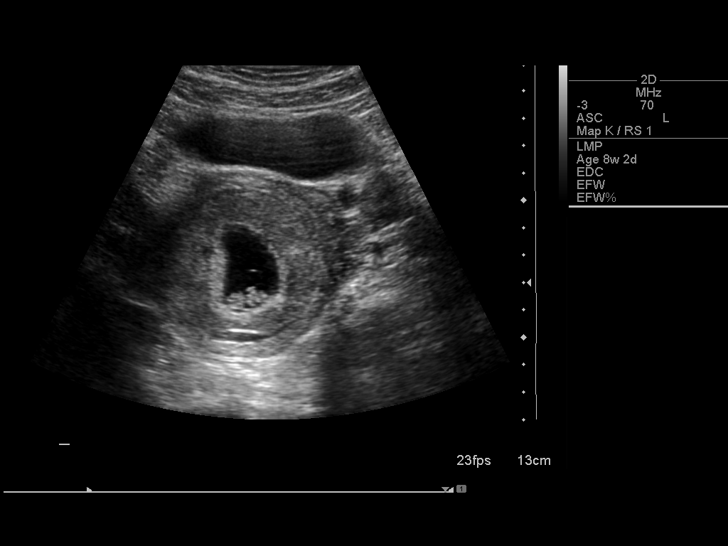
[im 21/52]
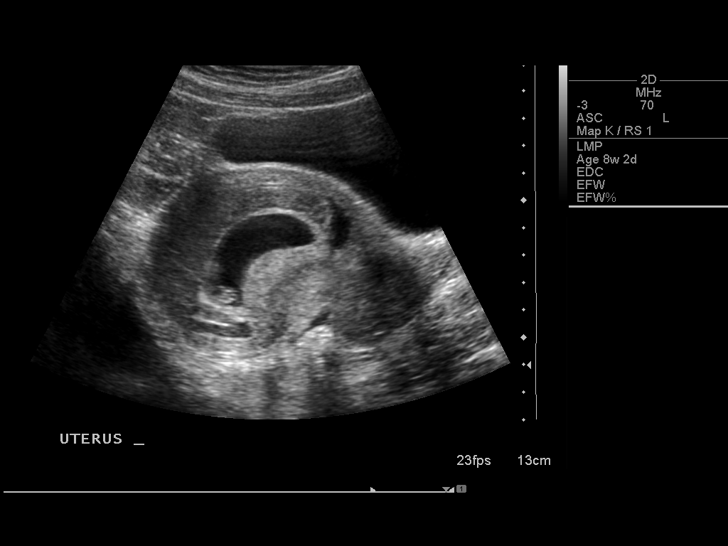
[im 25/52]
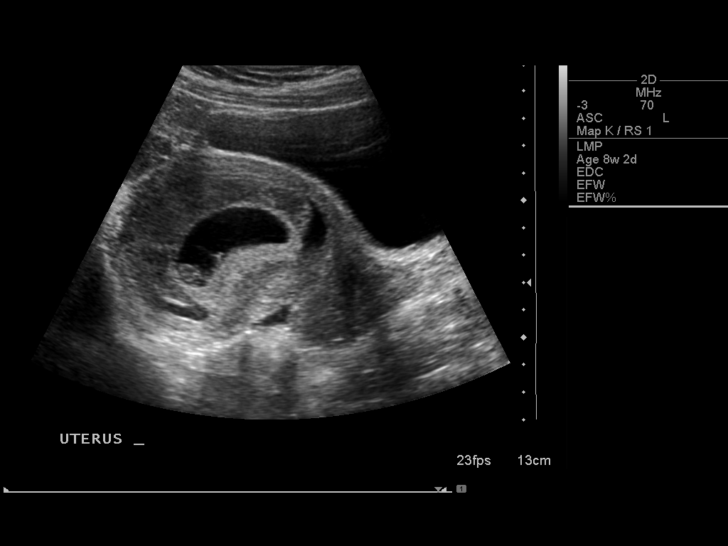
[im 29/52]
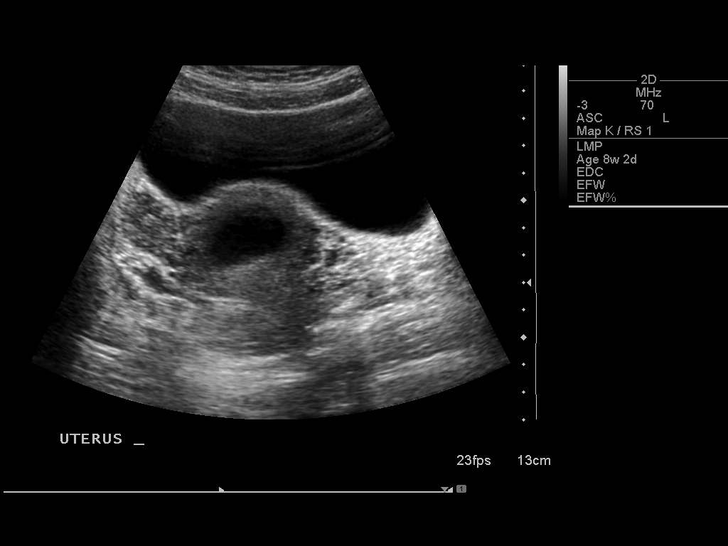
[im 33/52]
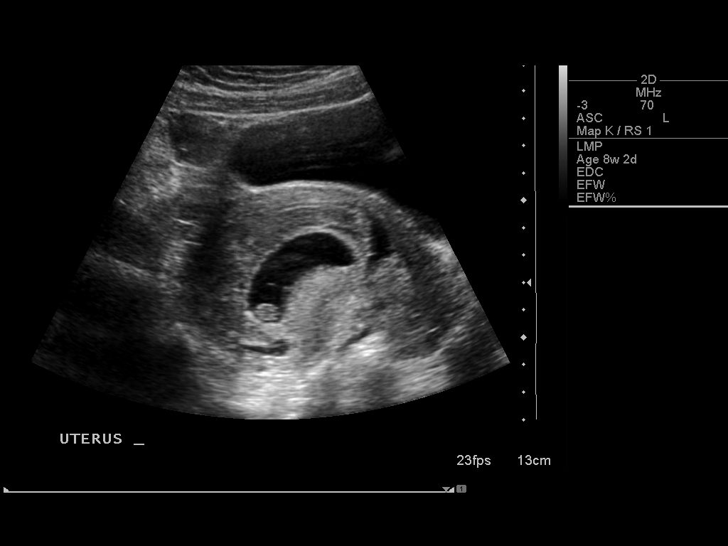
[im 36/52]
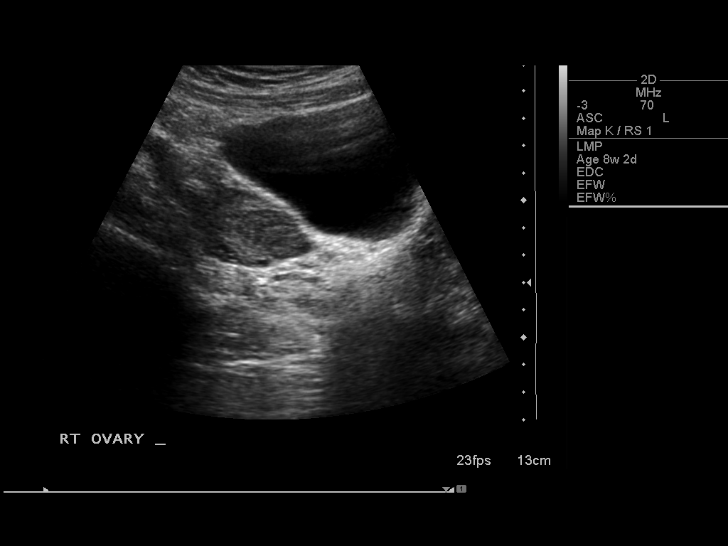
[im 40/52]
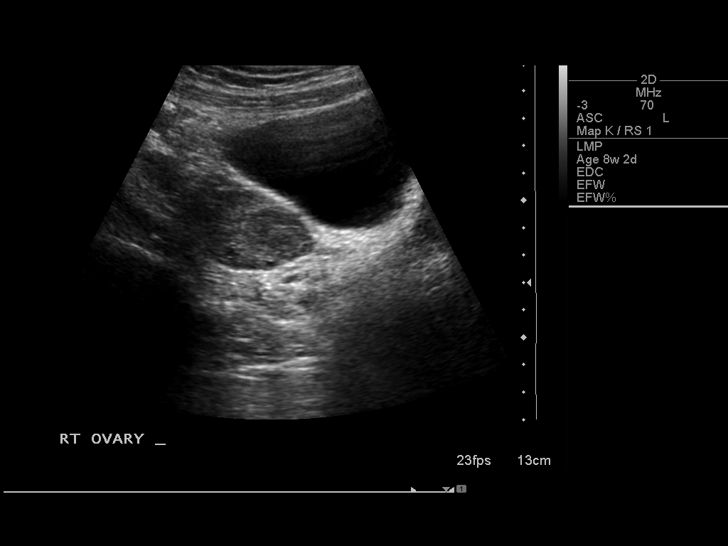
[im 44/52]
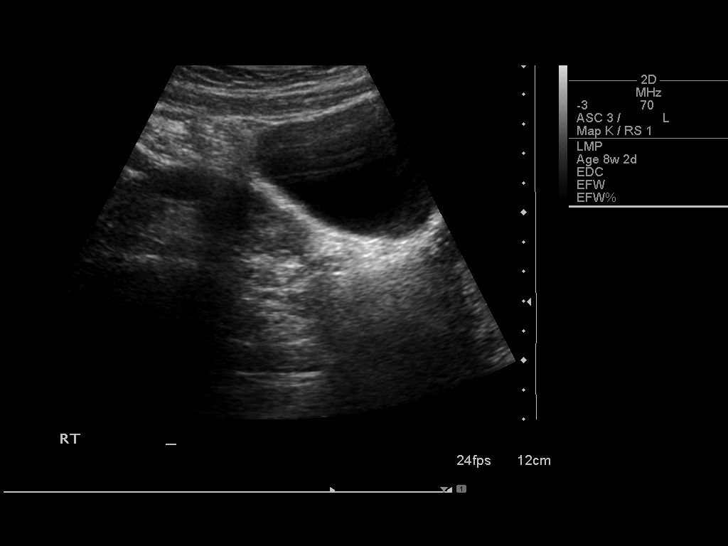
[im 48/52]
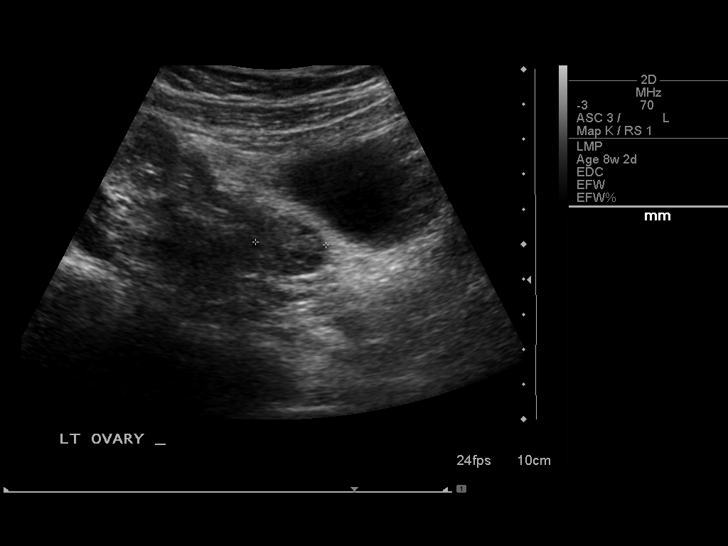
[im 52/52]
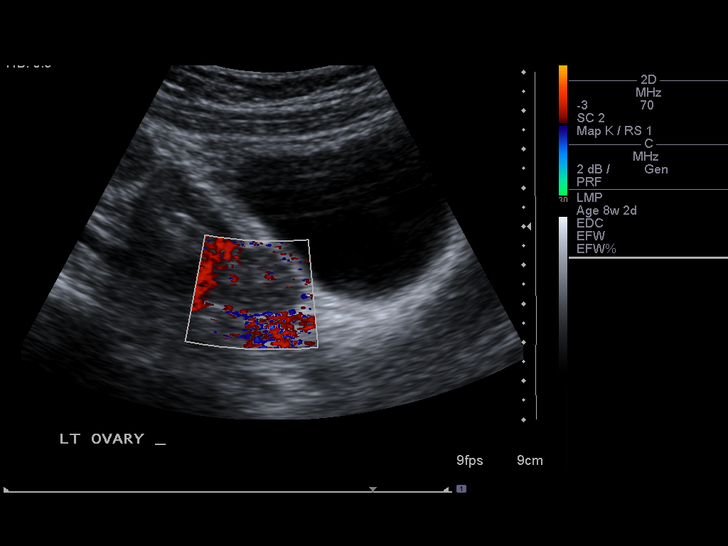

[14 of 28 positions shown; findings below may reference images not displayed]

Intrauterine gestational sac: Single gestational sac is ovoid in
shape.
Yolk sac: Present.
Embryo: Present.
Cardiac Activity: Present.
Heart Rate: 158 bpm

CRL:  15.1 mm  7 w  6 d       US EDC: 05/27/2013

Maternal uterus/Adnexae:
Moderate volume of subchorionic hemorrhage.  Ovaries are normal in
echotexture and appearance bilaterally. Trace volume of free fluid
the cul-de-sac.
IMPRESSION: 1.  Single viable IUP with crown rump length of 15.1 mm
corresponding to an estimated gestational age of 7 weeks and 6
days.  Fetal heart rate of 158 beats per minute.
2.  Moderate subchorionic hemorrhage.

## 2014-09-22 ENCOUNTER — Encounter (HOSPITAL_COMMUNITY): Payer: Self-pay | Admitting: Family Medicine

## 2016-12-20 ENCOUNTER — Ambulatory Visit (HOSPITAL_COMMUNITY)
Admission: EM | Admit: 2016-12-20 | Discharge: 2016-12-20 | Disposition: A | Discharge status: Home / Self Care | Payer: Medicaid Other | Attending: Family Medicine | Admitting: Family Medicine

## 2016-12-20 ENCOUNTER — Encounter (HOSPITAL_COMMUNITY): Payer: Self-pay | Admitting: *Deleted

## 2016-12-20 DIAGNOSIS — J069 Acute upper respiratory infection, unspecified: Secondary | ICD-10-CM | POA: Diagnosis not present

## 2016-12-20 DIAGNOSIS — B9789 Other viral agents as the cause of diseases classified elsewhere: Secondary | ICD-10-CM

## 2016-12-20 MED ORDER — OSELTAMIVIR PHOSPHATE 75 MG PO CAPS: 75.0000 mg | ORAL_CAPSULE | Freq: Every day | ORAL | 0 refills | Status: DC
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
## 2016-12-20 NOTE — ED Provider Notes (Signed)
CSN: [id number]     Arrival date & time 12/20/16  1104 History   None    Chief Complaint  Patient presents with  . Headache   (Consider location/radiation/quality/duration/timing/severity/associated sxs/prior Treatment) 26 year old female presents to clinic with chief complaint of cough and headache, she denies fever, muscle aches, body aches, nausea, vomiting, congestion, or other complaints, no change in appetite, no decrease in fluid intake or decrease in urinary output. Husband diagnosed by another clinic with Flu A and started on treatment.   The history is provided by the patient.  Headache    Past Medical History:  Diagnosis Date  . Medical history non-contributory    Past Surgical History:  Procedure Laterality Date  . NO PAST SURGERIES     History reviewed. No pertinent family history. Social History  Substance Use Topics  . Smoking status: Never Smoker  . Smokeless tobacco: Never Used  . Alcohol use No   OB History    Gravida Para Term Preterm AB Living   2 2 2     2    SAB TAB Ectopic Multiple Live Births           2     Review of Systems  Reason unable to perform ROS: as covered in HPI.  Neurological: Positive for headaches.  All other systems reviewed and are negative.   Allergies  Patient has no known allergies.  Home Medications   Prior to Admission medications   Medication Sig Start Date End Date Taking? Authorizing Provider  calcium carbonate (TUMS EX) 750 MG chewable tablet Chew 2 tablets by mouth at bedtime as needed for heartburn.    Historical Provider, MD  ibuprofen (ADVIL,MOTRIN) 600 MG tablet Take 1 tablet (600 mg total) by mouth every 6 (six) hours as needed for pain. 05/27/13   Tracey Harrieshomas Henley, MD  oseltamivir (TAMIFLU) 75 MG capsule Take 1 capsule (75 mg total) by mouth daily. 12/20/16   Dorena BodoLawrence Aleks Nawrot, NP  oxyCODONE-acetaminophen (PERCOCET/ROXICET) 5-325 MG per tablet Take 1 tablet by mouth every 6 (six) hours as needed. 05/27/13   Tracey Harrieshomas  Henley, MD  Prenatal Vit-Fe Fumarate-FA (PRENATAL MULTIVITAMIN) TABS Take 1 tablet by mouth at bedtime.     Historical Provider, MD   Meds Ordered and Administered this Visit  Medications - No data to display  BP 120/78 (BP Location: Left Arm)   Pulse 78   Temp 98.7 F (37.1 C) (Oral)   Resp 17   SpO2 100%  No data found.   Physical Exam  Constitutional: She is oriented to person, place, and time. She appears well-developed and well-nourished. She does not have a sickly appearance. She does not appear ill. No distress.  HENT:  Head: Normocephalic.  Right Ear: Tympanic membrane and external ear normal.  Left Ear: Tympanic membrane and external ear normal.  Nose: Nose normal. Right sinus exhibits no maxillary sinus tenderness and no frontal sinus tenderness. Left sinus exhibits no maxillary sinus tenderness and no frontal sinus tenderness.  Mouth/Throat: Uvula is midline and oropharynx is clear and moist. No oropharyngeal exudate.  Eyes: Pupils are equal, round, and reactive to light.  Neck: Normal range of motion. Neck supple. No JVD present.  Cardiovascular: Normal rate and regular rhythm.   Pulmonary/Chest: Effort normal and breath sounds normal. No respiratory distress. She has no wheezes.  Abdominal: Soft. Bowel sounds are normal. She exhibits no distension. There is no tenderness. There is no guarding.  Lymphadenopathy:    She has no cervical adenopathy.  Neurological: She is alert and oriented to person, place, and time.  Skin: Skin is warm and dry. Capillary refill takes less than 2 seconds. She is not diaphoretic.  Psychiatric: She has a normal mood and affect.  Nursing note and vitals reviewed.   Urgent Care Course     Procedures (including critical care time)  Labs Review Labs Reviewed - No data to display  Imaging Review No results found.   Visual Acuity Review  Right Eye Distance:   Left Eye Distance:   Bilateral Distance:    Right Eye Near:   Left  Eye Near:    Bilateral Near:         MDM   1. Viral URI   You most likely have a viral URI, I advise rest, plenty of fluids and management of symptoms with over the counter medicines. For symptoms you may take Tylenol as needed every 4-6 hours for body aches or fever, not to exceed 4,000 mg a day, Take mucinex or mucinex DM ever 12 hours with a full glass of water, you may use an inhaled steroid such as Flonase, 2 sprays each nostril once a day for congestion, or an antihistamine such as Claritin or Zyrtec once a day. Should your symptoms worsen or fail to resolve, follow up with your primary care provider or return to clinic.   Do to your known exposure to Flu A from your husband, and since you have two young children, it is consistent from [facility] recommendations to start Tamiflu on a preventative basis. Take 1 tablet daily, should you start having flu like symptoms, increase to twice daily till you have finished the prescription.      Dorena Bodo, NP 12/20/16 1326

## 2016-12-20 NOTE — ED Triage Notes (Signed)
Patient reports headache since yesterday, has been around husband and children who have had flu like symptoms. No fevers.

## 2018-09-05 ENCOUNTER — Encounter: Payer: Self-pay | Admitting: Obstetrics & Gynecology

## 2018-10-11 ENCOUNTER — Ambulatory Visit (INDEPENDENT_AMBULATORY_CARE_PROVIDER_SITE_OTHER): Payer: Managed Care, Other (non HMO) | Admitting: Obstetrics & Gynecology

## 2018-10-11 ENCOUNTER — Telehealth: Payer: Self-pay | Admitting: Obstetrics & Gynecology

## 2018-10-11 ENCOUNTER — Encounter: Payer: Self-pay | Admitting: Obstetrics & Gynecology

## 2018-10-11 VITALS — BP 110/62 | HR 74 | Resp 14 | Ht 67.5 in | Wt 152.0 lb

## 2018-10-11 DIAGNOSIS — L700 Acne vulgaris: Secondary | ICD-10-CM | POA: Diagnosis not present

## 2018-10-11 DIAGNOSIS — L709 Acne, unspecified: Secondary | ICD-10-CM | POA: Insufficient documentation

## 2018-10-11 DIAGNOSIS — Z01419 Encounter for gynecological examination (general) (routine) without abnormal findings: Secondary | ICD-10-CM

## 2018-10-11 MED ORDER — DROSPIRENONE-ETHINYL ESTRADIOL 3-0.02 MG PO TABS: 1.0000 | ORAL_TABLET | Freq: Every day | ORAL | 12 refills | Status: DC

## 2018-10-11 NOTE — Progress Notes (Addendum)
27 y.o. [id number] Single White or Caucasian female here for annual exam.  Was a prior patient until she had her pregnancies.  She has used a Mirena in the past.  She now has two kids.  She has used one in the past.  She used a paraguard after her second pregnancy.  It was placed post partum but she had much heavier bleeding.  She had it removed after two years.  Currently on Yaz and doing fine with it.  Is having acne issues.  Dermat[facility] at [facility].  Patient's last menstrual period was 10/11/2018.          Sexually active: Yes.    The current method of family planning is OCP (estrogen/progesterone).    Exercising: No.  The patient has a physically strenuous job, but has no regular exercise apart from work.  Smoker:  no  Health Maintenance: Pap:  2018 per patient (addendum:  Normal pap obtained 05/16/17 and records will be scanned into Epic) History of abnormal Pap:  No MMG:  NA Colonoscopy:  NA BMD:   NA TDaP:  Not sure Pneumonia vaccine(s):  na Shingrix:   na Hep C testing: none Screening Labs: not indicated   reports that she has never smoked. She has never used smokeless tobacco. She reports that she does not drink alcohol or use drugs.  Past Medical History:  Diagnosis Date  . Anemia   . Medical history non-contributory     Past Surgical History:  Procedure Laterality Date  . NO PAST SURGERIES      Current Outpatient Medications  Medication Sig Dispense Refill  . drospirenone-ethinyl estradiol (YAZ,GIANVI,LORYNA) 3-0.02 MG tablet Take 1 tablet by mouth daily.     No current facility-administered medications for this visit.     No family history on file.  Review of Systems  All other systems reviewed and are negative.   Exam:   BP 110/62   Pulse 74   Resp 14   Ht 5' 7.5" (1.715 m)   Wt 152 lb (68.9 kg)   LMP 10/11/2018   BMI 23.46 kg/m     Height: 5' 7.5" (171.5 cm)  Ht Readings from Last 3 Encounters:  10/11/18 5' 7.5" (1.715 m)   05/24/13 5\' 9"  (1.753 m)  05/21/13 5\' 9"  (1.753 m)    General appearance: alert, cooperative and appears stated age Head: Normocephalic, without obvious abnormality, atraumatic Neck: no adenopathy, supple, symmetrical, trachea midline and thyroid normal to inspection and palpation Lungs: clear to auscultation bilaterally Breasts: normal appearance, no masses or tenderness Heart: regular rate and rhythm Abdomen: soft, non-tender; bowel sounds normal; no masses,  no organomegaly Extremities: extremities normal, atraumatic, no cyanosis or edema Skin: Skin color, texture, turgor normal. No rashes or lesions Lymph nodes: Cervical, supraclavicular, and axillary nodes normal. No abnormal inguinal nodes palpated Neurologic: Grossly normal   Pelvic: External genitalia:  no lesions              Urethra:  normal appearing urethra with no masses, tenderness or lesions              Bartholins and Skenes: normal                 Vagina: normal appearing vagina with normal color and discharge, no lesions              Cervix: no lesions              Pap taken: Yes.   (  will obtain records about last pap smear) Bimanual Exam:  Uterus:  normal size, contour, position, consistency, mobility, non-tender              Adnexa: normal adnexa and no mass, fullness, tenderness               Rectovaginal: Confirms               Anus:  normal sphincter tone, no lesions  Chaperone was present for exam.  A:  Well Woman with normal exam Acne Desires long acting contraceptive method--Mirena IUD  P:   Mammogram recommended starting age 41 pap smear obtained today and held.  Will get outside records to see if needs to be send  (pap not ordered as had neg pap 05/18/17) Vaccines records will be obtained Will precert Mirena IUD for pt--desires to know cost before proceeding with placement.  She will return for this if desires placement New dermatology name given Return annually or prn

## 2018-10-11 NOTE — Telephone Encounter (Signed)
Spoke with patient regarding benefit for a Mirena IUD insertion. Patient understood and agreeable. Patient ready to schedule. Patient scheduled, Monday, 10/15/18 with Dr Hyacinth MeekerMiller.  Patient aware of appointment date, arrival time and cancellation policy. No further questions.   Forwarding to Dr Hyacinth MeekerMiller for final review. Patient is agreeable to disposition. Will close encounter

## 2018-10-11 NOTE — Telephone Encounter (Signed)
Call placed to patient to review benefits and to schedule appointment for a Mirena IUD insertion. Left voicemail message requesting a return call   cc: Dr Hyacinth MeekerMiller  cc: Soundra Pilonosa Davis  cc: Debby Freibergerrra Myers, CNA

## 2018-10-15 ENCOUNTER — Ambulatory Visit (INDEPENDENT_AMBULATORY_CARE_PROVIDER_SITE_OTHER): Payer: Managed Care, Other (non HMO) | Admitting: Obstetrics & Gynecology

## 2018-10-15 ENCOUNTER — Encounter: Payer: Self-pay | Admitting: Obstetrics & Gynecology

## 2018-10-15 VITALS — BP 108/70 | HR 88 | Resp 16 | Ht 67.5 in | Wt 153.6 lb

## 2018-10-15 DIAGNOSIS — Z30430 Encounter for insertion of intrauterine contraceptive device: Secondary | ICD-10-CM

## 2018-10-15 DIAGNOSIS — Z01812 Encounter for preprocedural laboratory examination: Secondary | ICD-10-CM | POA: Diagnosis not present

## 2018-10-15 LAB — POCT URINE PREGNANCY: Preg Test, Ur: NEGATIVE
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
## 2018-12-20 ENCOUNTER — Other Ambulatory Visit: Payer: Self-pay

## 2018-12-20 ENCOUNTER — Ambulatory Visit (INDEPENDENT_AMBULATORY_CARE_PROVIDER_SITE_OTHER): Payer: Managed Care, Other (non HMO) | Admitting: Obstetrics & Gynecology

## 2018-12-20 ENCOUNTER — Encounter: Payer: Self-pay | Admitting: Obstetrics & Gynecology

## 2018-12-20 VITALS — BP 108/64 | HR 72 | Resp 12 | Ht 67.5 in | Wt 158.4 lb

## 2018-12-20 DIAGNOSIS — Z30431 Encounter for routine checking of intrauterine contraceptive device: Secondary | ICD-10-CM
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
## 2019-01-18 ENCOUNTER — Telehealth: Payer: Self-pay | Admitting: Obstetrics & Gynecology

## 2019-01-18 DIAGNOSIS — Z30431 Encounter for routine checking of intrauterine contraceptive device: Secondary | ICD-10-CM

## 2019-01-18 NOTE — Telephone Encounter (Signed)
Spoke with patient. Saw Dr. Hyacinth Meeker last month.  She states she was to call back with bleeding update for Dr. Hyacinth Meeker. Had vaginal bleeding for last two weeks. Not currently bleeding. Bleeding was "darker" and pt thought cycle would end and then restarted with bright red vaginal bleeding. No pain.  Declines office visit for evaluation. States she feels well, dizziness is baseline for her. Does not feel she needs evaluation at this time.   I advised patient to keep calendar of menses, call back if bleeding continues or is soaking pads or if any abdominal pain or cramping. She verbalizes understanding of symptoms of bleeding emergencies and when to call back to office or go to urgent care or emergency room as needed. Patient is agreeable and verbalizes understanding of plan and will call back if condition changes or worsens.   To Dr. Hyacinth Meeker to review.  Okay to schedule ultrasound? Pt would like to plan this at this time.

## 2019-01-18 NOTE — Telephone Encounter (Signed)
Patient is calling regarding abnormal bleeding after IUD insertion. Patient stated that she had IUD inserted in November. Patient stated that cycles are lasting 2 weeks with continued spotting. Patient stated that she is experiencing dizziness and lightheadedness, but stated that she has not been taking iron medication and believes it could be from that. Patient stated that the bleeding is not heavy, just ongoing.

## 2019-01-18 NOTE — Telephone Encounter (Signed)
Spoke with patient and ultrasound is scheduled.  She will call back with any concerns.  Encounter closed.

## 2019-01-18 NOTE — Telephone Encounter (Signed)
I'd like to proceed with ultrasound when pt is ready to proceed.  Her irregular bleeding has lasted longer than is typical with IUD.

## 2019-01-31 ENCOUNTER — Ambulatory Visit (INDEPENDENT_AMBULATORY_CARE_PROVIDER_SITE_OTHER): Payer: Managed Care, Other (non HMO)

## 2019-01-31 ENCOUNTER — Ambulatory Visit (INDEPENDENT_AMBULATORY_CARE_PROVIDER_SITE_OTHER): Payer: Managed Care, Other (non HMO) | Admitting: Obstetrics & Gynecology

## 2019-01-31 ENCOUNTER — Other Ambulatory Visit: Payer: Self-pay

## 2019-01-31 VITALS — BP 100/62 | HR 72 | Resp 16 | Ht 67.5 in | Wt 157.8 lb

## 2019-01-31 DIAGNOSIS — Z975 Presence of (intrauterine) contraceptive device: Secondary | ICD-10-CM

## 2019-01-31 DIAGNOSIS — N926 Irregular menstruation, unspecified: Secondary | ICD-10-CM

## 2019-01-31 DIAGNOSIS — Z30431 Encounter for routine checking of intrauterine contraceptive device: Secondary | ICD-10-CM

## 2019-01-31 NOTE — Progress Notes (Signed)
28 y.o. [id number] Single White or Caucasian female here for pelvic ultrasound due to continued spotting with Mirena IUD.  This is her second Mirena IUD but she had no bleeding with her first Mirena so this has been a different course with this IUD.  Here to ensure placement is correct.  Denies pelvic pain..  Cycle in January lasted 14 days and then cycle in February was 2 days followed by dark bleeding for one day and then bleeding stopped for one day.  Then she started bleeding again that was bright red and this lasted for another week.    No LMP recorded. (Menstrual status: IUD).  Contraception: Mirena IUD  Findings:  UTERUS: 7.1 x 5.0 x 3.6cm with correctly placed IUD EMS: 4.31mm ADNEXA: Left ovary: 3.2 x 1.9 x 1.6cm        Right ovary: 3.4 x 1.8 x 1.7cm CUL DE SAC: no free fluid  Discussion:  Findings reviewed with pt.  As endometrium is thin and IUD is in correct place, I feel she should monitor for the full six months after placement before deciding if she is going to have this removed and consider another option for contraception or have this Mirena removed and new one placed.  She will give me update at the six month mark.  Pt comfortable with plan.  Assessment:  Spotting with mirena IUD (which is not what she experienced with her prior Mirena IUD)  Plan:  Pt will give me an update in May and will make plan for different contraception or removal and replace Mirena at that time.  ~15 minutes spent with patient >50% of time was in face to face discussion of above.

## 2019-02-03 ENCOUNTER — Encounter: Payer: Self-pay | Admitting: Obstetrics & Gynecology

## 2019-08-19 ENCOUNTER — Telehealth: Payer: Self-pay | Admitting: *Deleted

## 2019-08-19 DIAGNOSIS — Z30432 Encounter for removal of intrauterine contraceptive device: Secondary | ICD-10-CM

## 2019-08-19 NOTE — Telephone Encounter (Signed)
Message left to return call to Triage Nurse at [phone].    

## 2019-08-19 NOTE — Telephone Encounter (Signed)
Patient want to discuss switching birth control. 

## 2019-08-20 ENCOUNTER — Telehealth: Payer: Self-pay | Admitting: Obstetrics & Gynecology

## 2019-08-20 NOTE — Telephone Encounter (Signed)
Call placed to patient in reference to IUD removal. Patient to return call to me with new insurance information for scheduled appointment 08/27/19.

## 2019-08-20 NOTE — Telephone Encounter (Signed)
Spoke with patient. Patient request to proceed with Mirena IUD removal, would like to discuss OCP. Patient was seen for PUS and consult 01/31/19, reports irregular bleeding and cramping with IUD has not resolved. IUD removal scheduled for 10/6 at 10:30am with Dr. Sabra Heck, will discuss OCP at that time. Order placed for IUD removal for precert. Patient verbalizes understanding and is agreeable.   Routing to provider for final review. Patient is agreeable to disposition. Will close encounter.  Cc: Lerry Liner, Magdalene Patricia

## 2019-08-20 NOTE — Telephone Encounter (Signed)
Patient is returning a call to Triage °

## 2019-08-23 ENCOUNTER — Other Ambulatory Visit: Payer: Self-pay

## 2019-08-27 ENCOUNTER — Other Ambulatory Visit: Payer: Self-pay

## 2019-08-27 ENCOUNTER — Ambulatory Visit (INDEPENDENT_AMBULATORY_CARE_PROVIDER_SITE_OTHER): Payer: 59 | Admitting: Obstetrics & Gynecology

## 2019-08-27 ENCOUNTER — Encounter: Payer: Self-pay | Admitting: Obstetrics & Gynecology

## 2019-08-27 VITALS — BP 106/60 | HR 72 | Temp 97.6°F | Ht 67.5 in | Wt 166.0 lb

## 2019-08-27 DIAGNOSIS — Z30011 Encounter for initial prescription of contraceptive pills: Secondary | ICD-10-CM | POA: Diagnosis not present

## 2019-08-27 DIAGNOSIS — Z30432 Encounter for removal of intrauterine contraceptive device: Secondary | ICD-10-CM | POA: Diagnosis not present

## 2019-08-27 LAB — POCT URINE PREGNANCY: Preg Test, Ur: NEGATIVE

## 2019-08-27 MED ORDER — DROSPIRENONE-ETHINYL ESTRADIOL 3-0.02 MG PO TABS: 1.0000 | ORAL_TABLET | Freq: Every day | ORAL | 4 refills | Status: DC
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
## 2020-02-25 NOTE — Progress Notes (Signed)
29 y.o. G80P2002 Married White or Caucasian female here for annual exam.  Doing well.  Cycles seem to have normalized.  Last month, she had a 3-4 day cycle.    Was diagnosed with cataracts this year.  Is desirous of changing practices.  D/w pt need to see ophthalmologist and not optometrist.    LMP:  Mid March, pt not keeping track Sexually active: Yes.    The current method of family planning is OCP (estrogen/progesterone).    Exercising: Yes.    walking & eliptical Smoker:  no  Health Maintenance: Pap:  05-16-17 neg History of abnormal Pap:  no MMG:  none Colonoscopy:  none BMD:   none TDaP:  2014 Pneumonia vaccine(s):  none Shingrix:   n/a Hep C testing: not done Screening Labs: ordered today   reports that she has never smoked. She has never used smokeless tobacco. She reports that she does not drink alcohol or use drugs.  Past Medical History:  Diagnosis Date  . Anemia     Past Surgical History:  Procedure Laterality Date  . NO PAST SURGERIES      Current Outpatient Medications  Medication Sig Dispense Refill  . drospirenone-ethinyl estradiol (YAZ) 3-0.02 MG tablet Take 1 tablet by mouth daily. 3 Package 4  . levonorgestrel (MIRENA) 20 MCG/24HR IUD 1 each by Intrauterine route once.     No current facility-administered medications for this visit.    No family history on file.  Review of Systems  Constitutional: Negative.   HENT: Negative.   Eyes: Negative.   Respiratory: Negative.   Cardiovascular: Negative.   Gastrointestinal: Negative.   Endocrine: Negative.   Genitourinary: Negative.   Musculoskeletal: Negative.   Skin: Negative.   Allergic/Immunologic: Negative.   Neurological: Negative.   Psychiatric/Behavioral: Negative.     Exam:   Vitals:   02/27/20 0824  BP: 120/76  Pulse: 64  Resp: 16  Temp: 97.7 F (36.5 C)    General appearance: alert, cooperative and appears stated age Head: Normocephalic, without obvious abnormality,  atraumatic Neck: no adenopathy, supple, symmetrical, trachea midline and thyroid normal to inspection and palpation Lungs: clear to auscultation bilaterally Breasts: normal appearance, no masses or tenderness Heart: regular rate and rhythm Abdomen: soft, non-tender; bowel sounds normal; no masses,  no organomegaly Extremities: extremities normal, atraumatic, no cyanosis or edema Skin: Skin color, texture, turgor normal. No rashes or lesions Lymph nodes: Cervical, supraclavicular, and axillary nodes normal. No abnormal inguinal nodes palpated Neurologic: Grossly normal   Pelvic: External genitalia:  no lesions              Urethra:  normal appearing urethra with no masses, tenderness or lesions              Bartholins and Skenes: normal                 Vagina: normal appearing vagina with normal color and discharge, no lesions              Cervix: no lesions              Pap taken: Yes.   Bimanual Exam:  Uterus:  normal size, contour, position, consistency, mobility, non-tender              Adnexa: normal adnexa and no mass, fullness, tenderness               Rectovaginal: Confirms  Anus:  normal sphincter tone, no lesions  Chaperone, Zenovia Jordan, CMA, was present for exam.  A:  Well Woman with normal exam On OCPs Cataract diagnosis  P:   Mammogram guidelines reviewed pap smear obtained today CBC, CMP, TSH and HbA1C RF for OCPs to pharmacy for 90 day supply Return annually or prn

## 2020-02-27 ENCOUNTER — Ambulatory Visit: Payer: 59 | Admitting: Obstetrics & Gynecology

## 2020-02-27 ENCOUNTER — Other Ambulatory Visit (HOSPITAL_COMMUNITY)
Admission: RE | Admit: 2020-02-27 | Discharge: 2020-02-27 | Disposition: A | Discharge status: Home / Self Care | Payer: 59 | Source: Ambulatory Visit | Attending: Obstetrics & Gynecology | Admitting: Obstetrics & Gynecology

## 2020-02-27 ENCOUNTER — Other Ambulatory Visit: Payer: Self-pay

## 2020-02-27 ENCOUNTER — Encounter: Payer: Self-pay | Admitting: Obstetrics & Gynecology

## 2020-02-27 VITALS — BP 120/76 | HR 64 | Temp 97.7°F | Resp 16 | Ht 69.25 in | Wt 155.0 lb

## 2020-02-27 DIAGNOSIS — H268 Other specified cataract: Secondary | ICD-10-CM | POA: Diagnosis not present

## 2020-02-27 DIAGNOSIS — Z124 Encounter for screening for malignant neoplasm of cervix: Secondary | ICD-10-CM | POA: Insufficient documentation

## 2020-02-27 DIAGNOSIS — H269 Unspecified cataract: Secondary | ICD-10-CM | POA: Insufficient documentation

## 2020-02-27 DIAGNOSIS — Z01419 Encounter for gynecological examination (general) (routine) without abnormal findings: Secondary | ICD-10-CM

## 2020-02-27 DIAGNOSIS — Z0000 Encounter for general adult medical examination without abnormal findings: Secondary | ICD-10-CM | POA: Diagnosis not present

## 2020-02-27 MED ORDER — DROSPIRENONE-ETHINYL ESTRADIOL 3-0.02 MG PO TABS: 1.0000 | ORAL_TABLET | Freq: Every day | ORAL | 4 refills | Status: AC

## 2020-02-28 LAB — COMPREHENSIVE METABOLIC PANEL
ALT: 10 IU/L (ref 0–32)
AST: 17 IU/L (ref 0–40)
Albumin/Globulin Ratio: 1.5 (ref 1.2–2.2)
Albumin: 4.4 g/dL (ref 3.9–5.0)
Alkaline Phosphatase: 65 IU/L (ref 39–117)
BUN/Creatinine Ratio: 10 (ref 9–23)
BUN: 8 mg/dL (ref 6–20)
Bilirubin Total: 0.4 mg/dL (ref 0.0–1.2)
CO2: 22 mmol/L (ref 20–29)
Calcium: 9.5 mg/dL (ref 8.7–10.2)
Chloride: 102 mmol/L (ref 96–106)
Creatinine, Ser: 0.81 mg/dL (ref 0.57–1.00)
GFR calc Af Amer: 114 mL/min/{1.73_m2} (ref >59)
GFR calc non Af Amer: 99 mL/min/{1.73_m2} (ref >59)
Globulin, Total: 2.9 g/dL (ref 1.5–4.5)
Glucose: 83 mg/dL (ref 65–99)
Potassium: 4.6 mmol/L (ref 3.5–5.2)
Sodium: 140 mmol/L (ref 134–144)
Total Protein: 7.3 g/dL (ref 6.0–8.5)

## 2020-02-28 LAB — CBC
Hematocrit: 43.8 % (ref 34.0–46.6)
Hemoglobin: 14.4 g/dL (ref 11.1–15.9)
MCH: 29.1 pg (ref 26.6–33.0)
MCHC: 32.9 g/dL (ref 31.5–35.7)
MCV: 89 fL (ref 79–97)
Platelets: 213 10*3/uL (ref 150–450)
RBC: 4.95 x10E6/uL (ref 3.77–5.28)
RDW: 11.7 % (ref 11.7–15.4)
WBC: 4.9 10*3/uL (ref 3.4–10.8)

## 2020-02-28 LAB — TSH: TSH: 0.969 u[IU]/mL (ref 0.450–4.500)

## 2020-02-28 LAB — HEMOGLOBIN A1C
Est. average glucose Bld gHb Est-mCnc: 97 mg/dL
Hgb A1c MFr Bld: 5 % (ref 4.8–5.6)

## 2020-03-02 LAB — CYTOLOGY - PAP: Diagnosis: NEGATIVE

## 2021-03-29 ENCOUNTER — Ambulatory Visit: Payer: 59

## 2022-12-22 DIAGNOSIS — Z419 Encounter for procedure for purposes other than remedying health state, unspecified: Secondary | ICD-10-CM | POA: Diagnosis not present

## 2023-01-20 DIAGNOSIS — Z419 Encounter for procedure for purposes other than remedying health state, unspecified: Secondary | ICD-10-CM | POA: Diagnosis not present

## 2023-02-20 DIAGNOSIS — Z419 Encounter for procedure for purposes other than remedying health state, unspecified: Secondary | ICD-10-CM | POA: Diagnosis not present

## 2023-03-14 ENCOUNTER — Telehealth: Payer: Self-pay

## 2023-03-14 NOTE — Telephone Encounter (Signed)
LVM for patient to call back to schedule PCP apt. AS, CMA 

## 2023-03-22 DIAGNOSIS — Z419 Encounter for procedure for purposes other than remedying health state, unspecified: Secondary | ICD-10-CM | POA: Diagnosis not present

## 2023-04-22 DIAGNOSIS — Z419 Encounter for procedure for purposes other than remedying health state, unspecified: Secondary | ICD-10-CM | POA: Diagnosis not present

## 2023-05-22 DIAGNOSIS — Z419 Encounter for procedure for purposes other than remedying health state, unspecified: Secondary | ICD-10-CM | POA: Diagnosis not present

## 2023-06-22 DIAGNOSIS — Z419 Encounter for procedure for purposes other than remedying health state, unspecified: Secondary | ICD-10-CM | POA: Diagnosis not present

## 2023-07-23 DIAGNOSIS — Z419 Encounter for procedure for purposes other than remedying health state, unspecified: Secondary | ICD-10-CM | POA: Diagnosis not present

## 2023-08-22 DIAGNOSIS — Z419 Encounter for procedure for purposes other than remedying health state, unspecified: Secondary | ICD-10-CM | POA: Diagnosis not present

## 2023-09-22 DIAGNOSIS — Z419 Encounter for procedure for purposes other than remedying health state, unspecified: Secondary | ICD-10-CM | POA: Diagnosis not present

## 2023-10-22 DIAGNOSIS — Z419 Encounter for procedure for purposes other than remedying health state, unspecified: Secondary | ICD-10-CM | POA: Diagnosis not present

## 2023-11-22 DIAGNOSIS — Z419 Encounter for procedure for purposes other than remedying health state, unspecified: Secondary | ICD-10-CM | POA: Diagnosis not present

## 2023-12-19 DIAGNOSIS — Z130 Encounter for screening for diseases of the blood and blood-forming organs and certain disorders involving the immune mechanism: Secondary | ICD-10-CM | POA: Diagnosis not present

## 2023-12-19 DIAGNOSIS — Z0000 Encounter for general adult medical examination without abnormal findings: Secondary | ICD-10-CM | POA: Diagnosis not present

## 2023-12-19 DIAGNOSIS — Z13220 Encounter for screening for lipoid disorders: Secondary | ICD-10-CM | POA: Diagnosis not present

## 2023-12-23 DIAGNOSIS — Z419 Encounter for procedure for purposes other than remedying health state, unspecified: Secondary | ICD-10-CM | POA: Diagnosis not present

## 2024-01-20 DIAGNOSIS — Z419 Encounter for procedure for purposes other than remedying health state, unspecified: Secondary | ICD-10-CM | POA: Diagnosis not present

## 2024-03-02 DIAGNOSIS — Z419 Encounter for procedure for purposes other than remedying health state, unspecified: Secondary | ICD-10-CM | POA: Diagnosis not present

## 2024-04-01 DIAGNOSIS — Z419 Encounter for procedure for purposes other than remedying health state, unspecified: Secondary | ICD-10-CM | POA: Diagnosis not present

## 2024-05-02 DIAGNOSIS — Z419 Encounter for procedure for purposes other than remedying health state, unspecified: Secondary | ICD-10-CM | POA: Diagnosis not present

## 2024-06-01 DIAGNOSIS — Z419 Encounter for procedure for purposes other than remedying health state, unspecified: Secondary | ICD-10-CM | POA: Diagnosis not present

## 2024-07-02 DIAGNOSIS — Z419 Encounter for procedure for purposes other than remedying health state, unspecified: Secondary | ICD-10-CM | POA: Diagnosis not present

## 2024-08-02 DIAGNOSIS — Z419 Encounter for procedure for purposes other than remedying health state, unspecified: Secondary | ICD-10-CM | POA: Diagnosis not present
